# Patient Record
Sex: Male | Born: 1950
Health system: Southern US, Community
[De-identification: ages and names within clinical notes are randomized; demographics above are authoritative.]

## PROBLEM LIST (undated history)

## (undated) DIAGNOSIS — N189 Chronic kidney disease, unspecified: Secondary | ICD-10-CM

## (undated) DIAGNOSIS — M1990 Unspecified osteoarthritis, unspecified site: Secondary | ICD-10-CM

## (undated) DIAGNOSIS — N400 Enlarged prostate without lower urinary tract symptoms: Secondary | ICD-10-CM

## (undated) DIAGNOSIS — E785 Hyperlipidemia, unspecified: Secondary | ICD-10-CM

## (undated) DIAGNOSIS — N529 Male erectile dysfunction, unspecified: Secondary | ICD-10-CM

## (undated) HISTORY — DX: Unspecified osteoarthritis, unspecified site: M19.90

## (undated) HISTORY — DX: Benign prostatic hyperplasia without lower urinary tract symptoms: N40.0

## (undated) HISTORY — DX: Chronic kidney disease, unspecified: N18.9

## (undated) HISTORY — DX: Hyperlipidemia, unspecified: E78.5

## (undated) HISTORY — DX: Male erectile dysfunction, unspecified: N52.9

## (undated) HISTORY — PX: OTHER SURGICAL HISTORY: SHX169

## (undated) HISTORY — PX: COLONOSCOPY W/ POLYPECTOMY: SHX1380

---

## 1996-04-21 HISTORY — PX: ANKLE FRACTURE SURGERY: SHX122

## 2009-09-25 ENCOUNTER — Encounter (INDEPENDENT_AMBULATORY_CARE_PROVIDER_SITE_OTHER): Payer: Self-pay | Admitting: *Deleted

## 2009-09-25 ENCOUNTER — Ambulatory Visit: Payer: Self-pay | Admitting: Gastroenterology

## 2009-10-09 ENCOUNTER — Ambulatory Visit: Payer: Self-pay | Admitting: Gastroenterology

## 2009-10-12 ENCOUNTER — Encounter: Payer: Self-pay | Admitting: Gastroenterology

## 2010-05-21 NOTE — Miscellaneous (Signed)
Summary: [facility]  Clinical Lists Changes  Medications: Added new medication of MOVIPREP 100 GM  SOLR (PEG-KCL-NACL-NASULF-NA ASC-C) As per prep instructions. - Signed Rx of MOVIPREP 100 GM  SOLR (PEG-KCL-NACL-NASULF-NA ASC-C) As per prep instructions.;  #1 x 0;  Signed;  Entered by: Ezra Sites RN;  Authorized by: Mardella Layman MD [facility];  Method used: Electronically to [facility] [address], [place], [place], [place]  [address], Ph: [phone], Fax: [phone] Observations: Added new observation of NKA: T (09/25/2009 13:14)    Prescriptions: MOVIPREP 100 GM  SOLR (PEG-KCL-NACL-NASULF-NA ASC-C) As per prep instructions.  #1 x 0   Entered by:   Ezra Sites RN   Authorized by:   Mardella Layman MD [facility]   Signed by:   Ezra Sites RN on 09/25/2009   Method used:   Electronically to        [facility]  [address] (retail)       [address]       [place], [place]  [address]       Ph: [phone]       Fax: [phone]   RxID:   [id number]

## 2010-05-21 NOTE — Letter (Signed)
Summary: [facility] Instructions  [facility]  [address] [place], [place] [address]   Phone: [phone]  Fax: [phone]       Jimmy Bonilla    July 04, 1950    MRN: [id number]        Procedure Day /Date:  Tuesday 10/09/2009     Arrival Time: 8:00 am      Procedure Time: 9:00 am     Location of Procedure:                    _x _  [facility] (4th Floor)                        PREPARATION FOR COLONOSCOPY WITH MOVIPREP   Starting 5 days prior to your procedure Thursday 6/16 do not eat nuts, seeds, popcorn, corn, beans, peas,  salads, or any raw vegetables.  Do not take any fiber supplements (e.g. Metamucil, Citrucel, and Benefiber).  THE DAY BEFORE YOUR PROCEDURE         DATE: Monday 6/20  1.  Drink clear liquids the entire day-NO SOLID FOOD  2.  Do not drink anything colored red or purple.  Avoid juices with pulp.  No orange juice.  3.  Drink at least 64 oz. (8 glasses) of fluid/clear liquids during the day to prevent dehydration and help the prep work efficiently.  CLEAR LIQUIDS INCLUDE: Water Jello Ice Popsicles Tea (sugar ok, no milk/cream) Powdered fruit flavored drinks Coffee (sugar ok, no milk/cream) Gatorade Juice: apple, white grape, white cranberry  Lemonade Clear bullion, consomm, broth Carbonated beverages (any kind) Strained chicken noodle soup Hard Candy                             4.  In the morning, mix first dose of MoviPrep solution:    Empty 1 Pouch A and 1 Pouch B into the disposable container    Add lukewarm drinking water to the top line of the container. Mix to dissolve    Refrigerate (mixed solution should be used within 24 hrs)  5.  Begin drinking the prep at 5:00 p.m. The MoviPrep container is divided by 4 marks.   Every 15 minutes drink the solution down to the next mark (approximately 8 oz) until the full liter is complete.   6.  Follow completed prep with 16 oz of clear liquid of your choice (Nothing  red or purple).  Continue to drink clear liquids until bedtime.  7.  Before going to bed, mix second dose of MoviPrep solution:    Empty 1 Pouch A and 1 Pouch B into the disposable container    Add lukewarm drinking water to the top line of the container. Mix to dissolve    Refrigerate  THE DAY OF YOUR PROCEDURE      DATE: Tuesday 6/21  Beginning at 4:00 a.m. (5 hours before procedure):         1. Every 15 minutes, drink the solution down to the next mark (approx 8 oz) until the full liter is complete.  2. Follow completed prep with 16 oz. of clear liquid of your choice.    3. You may drink clear liquids until 7:00 am (2 HOURS BEFORE PROCEDURE).   MEDICATION INSTRUCTIONS  Unless otherwise instructed, you should take regular prescription medications with a small sip of water   as early as possible the morning of  your procedure.           OTHER INSTRUCTIONS  You will need a responsible adult at least 59 years of age to accompany you and drive you home.   This person must remain in the waiting room during your procedure.  Wear loose fitting clothing that is easily removed.  Leave jewelry and other valuables at home.  However, you may wish to bring a book to read or  an iPod/MP3 player to listen to music as you wait for your procedure to start.  Remove all body piercing jewelry and leave at home.  Total time from sign-in until discharge is approximately 2-3 hours.  You should go home directly after your procedure and rest.  You can resume normal activities the  day after your procedure.  The day of your procedure you should not:   Drive   Make legal decisions   Operate machinery   Drink alcohol   Return to work  You will receive specific instructions about eating, activities and medications before you leave.    The above instructions have been reviewed and explained to me by  Ezra Sites RN  September 25, 2009 1:46 PM      I fully understand and can  verbalize these instructions _____________________________ Date _________

## 2010-05-21 NOTE — Procedures (Signed)
Summary: Colonoscopy  Patient: Jimmy Bonilla Note: All result statuses are Final unless otherwise noted.  Tests: (1) Colonoscopy (COL)   COL Colonoscopy           DONE     [facility]     [address]     [place], [place]  [address]           COLONOSCOPY PROCEDURE REPORT           PATIENT:  [name], [name]  MR#:  [id number]     BIRTHDATE:  May 20, 1950, 58 yrs. old  GENDER:  male     ENDOSCOPIST:  Vania Rea. Jarold Motto, MD, [facility]     REF. BY:  Rudi Heap, M.D.     PROCEDURE DATE:  10/09/2009     PROCEDURE:  Average-risk screening colonoscopy     G0121     ASA CLASS:  Class I     INDICATIONS:  Routine Risk Screening     MEDICATIONS:   Fentanyl 50 mcg IV, Versed 6 mg IV           DESCRIPTION OF PROCEDURE:   After the risks benefits and     alternatives of the procedure were thoroughly explained, informed     consent was obtained.  Digital rectal exam was performed and     revealed no abnormalities.   The LB [id number] endoscope     was introduced through the anus and advanced to the cecum, which     was identified by both the appendix and ileocecal valve, without     limitations.  The quality of the prep was excellent, using     MoviPrep.  The instrument was then slowly withdrawn as the colon     was fully examined.     <<PROCEDUREIMAGES>>           FINDINGS:  A sessile polyp was found in the descending colon. 5mm     flat polyp hot snare excised.  This was otherwise a normal     examination of the colon.   Retroflexed views in the rectum     revealed hypertrophied anal papillae.    The scope was then     withdrawn from the patient and the procedure completed.           COMPLICATIONS:  None     ENDOSCOPIC IMPRESSION:     1) Sessile polyp in the descending colon     2) Otherwise normal examination     3) Hypertrophied anal papillae     R/O ADENOMA.     RECOMMENDATIONS:     1) If the polyp(s) removed today are proven to be adenomatous     (pre-cancerous)  polyps, you will need a repeat colonoscopy in 5     years. Otherwise you should continue to follow colorectal cancer     screening guidelines for "routine risk" patients with colonoscopy     in 10 years.     REPEAT EXAM:  No           ______________________________     Vania Rea. Jarold Motto, MD, [facility]           CC:           n.     eSIGNED:   Vania Rea. Patterson at 10/09/2009 09:38 AM           [name], [id number]  Note: An exclamation mark (!) indicates a result that was not dispersed into the flowsheet.  Document Creation Date: 10/09/2009 9:39 AM _______________________________________________________________________  (1) Order result status: Final Collection or observation date-time: 10/09/2009 09:31 Requested date-time:  Receipt date-time:  Reported date-time:  Referring Physician:   Ordering Physician: Sheryn Bison [phone]) Specimen Source:  Source: Launa Grill Order Number: [id number] Lab site:   Appended Document: Colonoscopy     Procedures Next Due Date:    Colonoscopy: 09/2012

## 2010-05-21 NOTE — Letter (Signed)
Summary: Patient Notice- Polyp Results  [facility]  [address] [place], [place] [address]   Phone: [phone]  Fax: [phone]        October 12, 2009 MRN: [id number]    Jimmy Bonilla [address] [place], [place]  [address]    Dear Mr. MENG,  I am pleased to inform you that the colon polyp(s) removed during your recent colonoscopy was (were) found to be benign (no cancer detected) upon pathologic examination.  I recommend you have a repeat colonoscopy examination in 3_ years to look for recurrent polyps, as having colon polyps increases your risk for having recurrent polyps or even colon cancer in the future.POLYP IS A SERRATED ADENOMA....RECHECK EXAM 3 YEARS RECOMMENDED.  Should you develop new or worsening symptoms of abdominal pain, bowel habit changes or bleeding from the rectum or bowels, please schedule an evaluation with either your primary care physician or with me.  Additional information/recommendations:  X__ No further action with gastroenterology is needed at this time. Please      follow-up with your primary care physician for your other healthcare      needs.  __ Please call [phone] to schedule a return visit to review your      situation.  __ Please keep your follow-up visit as already scheduled.  __ Continue treatment plan as outlined the day of your exam.  Please call us if you are having persistent problems or have questions about your condition that have not been fully answered at this time.  Sincerely,  Mardella Layman MD [facility]  This letter has been electronically signed by your physician.  Appended Document: Patient Notice- Polyp Results letter mailed.

## 2011-03-27 DIAGNOSIS — N529 Male erectile dysfunction, unspecified: Secondary | ICD-10-CM | POA: Insufficient documentation

## 2011-03-27 DIAGNOSIS — R972 Elevated prostate specific antigen [PSA]: Secondary | ICD-10-CM | POA: Insufficient documentation

## 2011-11-21 LAB — LIPID PANEL: LDL Cholesterol: 108 mg/dL

## 2011-11-21 LAB — BASIC METABOLIC PANEL
BUN: 15 mg/dL (ref 4–21)
Creatinine: 0.9 mg/dL (ref 0.6–1.3)
Glucose: 86 mg/dL

## 2011-11-21 LAB — HEPATIC FUNCTION PANEL
AST: 18 U/L (ref 14–40)
Alkaline Phosphatase: 69 U/L (ref 25–125)
Bilirubin, Direct: 0.2 mg/dL (ref 0.01–0.4)
Bilirubin, Total: 0.7 mg/dL

## 2012-03-31 DIAGNOSIS — K4090 Unilateral inguinal hernia, without obstruction or gangrene, not specified as recurrent: Secondary | ICD-10-CM | POA: Insufficient documentation

## 2012-07-01 ENCOUNTER — Encounter: Payer: Self-pay | Admitting: *Deleted

## 2012-07-06 ENCOUNTER — Encounter: Payer: Self-pay | Admitting: Physician Assistant

## 2012-07-06 ENCOUNTER — Ambulatory Visit (INDEPENDENT_AMBULATORY_CARE_PROVIDER_SITE_OTHER): Payer: BC Managed Care – PPO | Admitting: Physician Assistant

## 2012-07-06 VITALS — BP 139/82 | HR 49 | Temp 97.0°F | Ht 70.0 in | Wt 179.2 lb

## 2012-07-06 DIAGNOSIS — I10 Essential (primary) hypertension: Secondary | ICD-10-CM | POA: Insufficient documentation

## 2012-07-06 DIAGNOSIS — Z0000 Encounter for general adult medical examination without abnormal findings: Secondary | ICD-10-CM

## 2012-07-06 DIAGNOSIS — N400 Enlarged prostate without lower urinary tract symptoms: Secondary | ICD-10-CM

## 2012-07-06 DIAGNOSIS — E785 Hyperlipidemia, unspecified: Secondary | ICD-10-CM | POA: Insufficient documentation

## 2012-07-06 LAB — ALT: ALT: 18 U/L (ref 0–53)

## 2012-07-06 LAB — POCT CBC
Lymph, poc: 1.4 (ref 0.6–3.4)
MCH, POC: 31.2 pg (ref 27–31.2)
MCHC: 33.5 g/dL (ref 31.8–35.4)
MPV: 7.8 fL (ref 0–99.8)
POC LYMPH PERCENT: 24.6 %L (ref 10–50)
Platelet Count, POC: 238 10*3/uL (ref 142–424)
RBC: 4.7 M/uL (ref 4.69–6.13)
RDW, POC: 13.8 %
WBC: 5.8 10*3/uL (ref 4.6–10.2)

## 2012-07-06 LAB — POCT URINALYSIS DIPSTICK
Bilirubin, UA: NEGATIVE
Glucose, UA: NEGATIVE
Leukocytes, UA: NEGATIVE
Nitrite, UA: NEGATIVE
pH, UA: 8

## 2012-07-06 LAB — AST: AST: 22 U/L (ref 0–37)

## 2012-07-06 LAB — BASIC METABOLIC PANEL
Chloride: 101 mEq/L (ref 96–112)
Creat: 0.85 mg/dL (ref 0.50–1.35)
Potassium: 5.1 mEq/L (ref 3.5–5.3)

## 2012-07-06 LAB — LIPID PANEL
Cholesterol: 153 mg/dL (ref 0–200)
Total CHOL/HDL Ratio: 3.7 Ratio

## 2012-07-06 NOTE — Progress Notes (Signed)
  Subjective:    Patient ID: Jimmy Bonilla, male    DOB: 1950-06-26, 62 y.o.   MRN: [id number]  HPI    Review of Systems  All other systems reviewed and are negative.       Objective:   Physical Exam  Vitals reviewed. Constitutional: He is oriented to person, place, and time. He appears well-developed and well-nourished.  HENT:  Head: Normocephalic and atraumatic.  Right Ear: External ear normal.  Left Ear: External ear normal.  Nose: Nose normal.  Mouth/Throat: Oropharynx is clear and moist.  Eyes: Conjunctivae and EOM are normal. Pupils are equal, round, and reactive to light.  Neck: Normal range of motion. Neck supple.  Cardiovascular: Normal rate, regular rhythm, normal heart sounds and intact distal pulses.   Pulmonary/Chest: Effort normal and breath sounds normal.  Abdominal: Soft. Bowel sounds are normal.  Genitourinary: Rectum normal and prostate normal.  Musculoskeletal: Normal range of motion.  Neurological: He is alert and oriented to person, place, and time. He has normal reflexes.  Skin: Skin is warm and dry.  Psychiatric: He has a normal mood and affect. His behavior is normal. Judgment and thought content normal.          Assessment & Plan:  Complete Physical Exam Hyperlipidemia BPH  Orders Placed This Encounter  Procedures  . Basic Metabolic Panel  . AST  . ALT  . Lipid Panel  . PSA  . Urinalysis Dipstick  . POCT CBC

## 2012-07-12 ENCOUNTER — Other Ambulatory Visit: Payer: Self-pay | Admitting: Physician Assistant

## 2012-07-26 ENCOUNTER — Encounter: Payer: Self-pay | Admitting: General Practice

## 2012-07-26 ENCOUNTER — Ambulatory Visit (INDEPENDENT_AMBULATORY_CARE_PROVIDER_SITE_OTHER): Payer: BC Managed Care – PPO | Admitting: General Practice

## 2012-07-26 VITALS — BP 126/77 | HR 60 | Temp 97.0°F | Ht 70.0 in | Wt 180.0 lb

## 2012-07-26 DIAGNOSIS — J069 Acute upper respiratory infection, unspecified: Secondary | ICD-10-CM

## 2012-07-26 MED ORDER — AZITHROMYCIN 250 MG PO TABS: ORAL_TABLET | ORAL | Status: DC

## 2012-07-26 NOTE — Patient Instructions (Signed)

## 2012-07-26 NOTE — Progress Notes (Signed)
  Subjective:    Patient ID: Jimmy Bonilla, male    DOB: 06/25/1950, 62 y.o.   MRN: [id number]  Sinus Problem This is a new problem. The current episode started in the past 7 days. The problem has been gradually worsening since onset. There has been no fever. The pain is mild. Associated symptoms include congestion, coughing, sinus pressure and sneezing. Pertinent negatives include no chills or headaches. Past treatments include nothing.      Review of Systems  Constitutional: Negative for chills.  HENT: Positive for congestion, rhinorrhea, sneezing and sinus pressure.   Eyes: Positive for itching.  Respiratory: Positive for cough. Negative for chest tightness.   Cardiovascular: Negative for chest pain and palpitations.  Genitourinary: Negative for difficulty urinating.  Neurological: Negative for numbness and headaches.       Objective:   Physical Exam  Constitutional: He is oriented to person, place, and time. He appears well-developed and well-nourished.  HENT:  Head: Normocephalic and atraumatic.  Right Ear: External ear normal.  Left Ear: External ear normal.  Mouth/Throat: Posterior oropharyngeal erythema present.  Eyes: Conjunctivae and EOM are normal. Pupils are equal, round, and reactive to light.  Cardiovascular: Normal rate, regular rhythm and normal heart sounds.   No murmur heard. Pulmonary/Chest: No respiratory distress. He exhibits no tenderness.  Neurological: He is alert and oriented to person, place, and time.  Skin: Skin is warm and dry.  Psychiatric: He has a normal mood and affect.          Assessment & Plan:  Continue antibiotics even if feeling better Increase fluid intake Motrin or tylenol OTC OTC decongestant Proper handwashing Patient verbalized understanding  Raymon Mutton, FNP-C

## 2012-08-18 ENCOUNTER — Encounter: Payer: Self-pay | Admitting: Gastroenterology

## 2012-09-24 ENCOUNTER — Other Ambulatory Visit: Payer: Self-pay | Admitting: *Deleted

## 2012-09-24 MED ORDER — ATORVASTATIN CALCIUM 10 MG PO TABS: 10.0000 mg | ORAL_TABLET | Freq: Every day | ORAL | Status: DC

## 2012-11-16 ENCOUNTER — Encounter: Payer: Self-pay | Admitting: Gastroenterology

## 2012-12-06 ENCOUNTER — Ambulatory Visit (AMBULATORY_SURGERY_CENTER): Payer: BC Managed Care – PPO | Admitting: *Deleted

## 2012-12-06 ENCOUNTER — Encounter: Payer: Self-pay | Admitting: Gastroenterology

## 2012-12-06 VITALS — Ht 70.75 in | Wt 184.6 lb

## 2012-12-06 DIAGNOSIS — Z86010 Personal history of colonic polyps: Secondary | ICD-10-CM

## 2012-12-06 MED ORDER — MOVIPREP 100 G PO SOLR: 1.0000 | Freq: Once | ORAL | Status: DC

## 2012-12-06 NOTE — Progress Notes (Signed)
No egg or soy allergy. No anesthesia problems.  

## 2012-12-22 ENCOUNTER — Ambulatory Visit (AMBULATORY_SURGERY_CENTER): Payer: BC Managed Care – PPO | Admitting: Gastroenterology

## 2012-12-22 ENCOUNTER — Encounter: Payer: Self-pay | Admitting: Gastroenterology

## 2012-12-22 VITALS — BP 120/59 | HR 49 | Temp 97.4°F | Resp 14 | Ht 70.0 in | Wt 184.0 lb

## 2012-12-22 DIAGNOSIS — Z86010 Personal history of colonic polyps: Secondary | ICD-10-CM

## 2012-12-22 MED ORDER — SODIUM CHLORIDE 0.9 % IV SOLN: 500.0000 mL | INTRAVENOUS | Status: DC

## 2012-12-22 NOTE — Op Note (Signed)
[facility] [address] [place], [address]   COLONOSCOPY PROCEDURE REPORT  PATIENT: Jimmy, Bonilla  MR#: [id number] BIRTHDATE: 07/27/1950 , 61  yrs. old GENDER: Male ENDOSCOPIST: Mardella Layman, MD, [facility] REFERRED BY: PROCEDURE DATE:  12/22/2012 PROCEDURE:   Colonoscopy, surveillance First Screening Colonoscopy - Avg.  risk and is 50 yrs.  old or older - No.  Prior Negative Screening - Now for repeat screening. Less than 10 yrs  History of Adenoma - Now for follow-up colonoscopy & has been > or = to 3 yrs.  Yes hx of adenoma.  Has been 3 or more years since last colonoscopy. ASA CLASS:   Class II INDICATIONS:Patient's personal history of adenomatous colon polyps.  MEDICATIONS: propofol (Diprivan) 150mg  IV  DESCRIPTION OF PROCEDURE:   After the risks benefits and alternatives of the procedure were thoroughly explained, informed consent was obtained.  A digital rectal exam revealed no abnormalities of the rectum.   The LB [id number]  endoscope was introduced through the anus and advanced to the cecum, which was identified by both the appendix and ileocecal valve. No adverse events experienced.   The quality of the prep was good, using MoviPrep  The instrument was then slowly withdrawn as the colon was fully examined.      COLON FINDINGS: A normal appearing cecum, ileocecal valve, and appendiceal orifice were identified.  The ascending, hepatic flexure, transverse, splenic flexure, descending, sigmoid colon and rectum appeared unremarkable.  No polyps or cancers were seen. Retroflexed views revealed no abnormalities. The time to cecum=4 minutes 15 seconds.  Withdrawal time=6 minutes 00 seconds.  The scope was withdrawn and the procedure completed. COMPLICATIONS: There were no complications.  ENDOSCOPIC IMPRESSION: Normal colon...no recurrent serrated adenoma noted...  RECOMMENDATIONS: 1.  Repeat Colonoscopy in 5 years. 2.  Continue current  medications   eSigned:  Mardella Layman, MD, [facility] 12/22/2012 9:30 AM   cc: Rudi Heap, MD

## 2012-12-22 NOTE — Patient Instructions (Addendum)
YOU HAD AN ENDOSCOPIC PROCEDURE TODAY AT THE [facility]: Refer to the procedure report that was given to you for any specific questions about what was found during the examination.  If the procedure report does not answer your questions, please call your gastroenterologist to clarify.  If you requested that your care partner not be given the details of your procedure findings, then the procedure report has been included in a sealed envelope for you to review at your convenience later.  YOU SHOULD EXPECT: Some feelings of bloating in the abdomen. Passage of more gas than usual.  Walking can help get rid of the air that was put into your GI tract during the procedure and reduce the bloating. If you had a lower endoscopy (such as a colonoscopy or flexible sigmoidoscopy) you may notice spotting of blood in your stool or on the toilet paper. If you underwent a bowel prep for your procedure, then you may not have a normal bowel movement for a few days.  DIET: Your first meal following the procedure should be a light meal and then it is ok to progress to your normal diet.  A half-sandwich or bowl of soup is an example of a good first meal.  Heavy or fried foods are harder to digest and may make you feel nauseous or bloated.  Likewise meals heavy in dairy and vegetables can cause extra gas to form and this can also increase the bloating.  Drink plenty of fluids but you should avoid alcoholic beverages for 24 hours.  ACTIVITY: Your care partner should take you home directly after the procedure.  You should plan to take it easy, moving slowly for the rest of the day.  You can resume normal activity the day after the procedure however you should NOT DRIVE or use heavy machinery for 24 hours (because of the sedation medicines used during the test).    SYMPTOMS TO REPORT IMMEDIATELY: A gastroenterologist can be reached at any hour.  During normal business hours, 8:30 AM to 5:00 PM Monday through Friday,  call [phone].  After hours and on weekends, please call the GI answering service at [phone] who will take a message and have the physician on call contact you.   Following lower endoscopy (colonoscopy or flexible sigmoidoscopy):  Excessive amounts of blood in the stool  Significant tenderness or worsening of abdominal pains  Swelling of the abdomen that is new, acute  Fever of 100F or higher  FOLLOW UP: If any biopsies were taken you will be contacted by phone or by letter within the next 1-3 weeks.  Call your gastroenterologist if you have not heard about the biopsies in 3 weeks.  Our staff will call the home number listed on your records the next business day following your procedure to check on you and address any questions or concerns that you may have at that time regarding the information given to you following your procedure. This is a courtesy call and so if there is no answer at the home number and we have not heard from you through the emergency physician on call, we will assume that you have returned to your regular daily activities without incident.  SIGNATURES/CONFIDENTIALITY: You and/or your care partner have signed paperwork which will be entered into your electronic medical record.  These signatures attest to the fact that that the information above on your After Visit Summary has been reviewed and is understood.  Full responsibility of the confidentiality of this   discharge information lies with you and/or your care-partner.  Repeat colonoscopy in 5 years 

## 2012-12-22 NOTE — Progress Notes (Signed)
Patient did not experience any of the following events: a burn prior to discharge; a fall within the facility; wrong site/side/patient/procedure/implant event; or a hospital transfer or hospital admission upon discharge from the facility. (G8907) Patient did not have preoperative order for IV antibiotic SSI prophylaxis. (G8918)  

## 2012-12-22 NOTE — Progress Notes (Signed)
Lidocaine-40mg IV prior to Propofol InductionPropofol given over incremental dosages 

## 2012-12-23 ENCOUNTER — Telehealth: Payer: Self-pay | Admitting: *Deleted

## 2012-12-23 NOTE — Telephone Encounter (Signed)
Left message that we called for f/u 

## 2013-04-18 ENCOUNTER — Other Ambulatory Visit: Payer: Self-pay

## 2013-04-18 MED ORDER — ATORVASTATIN CALCIUM 10 MG PO TABS: 10.0000 mg | ORAL_TABLET | Freq: Every day | ORAL | Status: DC

## 2013-04-18 NOTE — Telephone Encounter (Signed)
Last seen 07/26/12  Jimmy Bonilla  Last lipid 07/06/12

## 2013-09-19 ENCOUNTER — Ambulatory Visit (INDEPENDENT_AMBULATORY_CARE_PROVIDER_SITE_OTHER): Payer: BC Managed Care – PPO | Admitting: Family Medicine

## 2013-09-19 ENCOUNTER — Encounter: Payer: Self-pay | Admitting: Family Medicine

## 2013-09-19 VITALS — BP 120/71 | HR 56 | Temp 97.0°F | Ht 69.5 in | Wt 182.0 lb

## 2013-09-19 DIAGNOSIS — E785 Hyperlipidemia, unspecified: Secondary | ICD-10-CM

## 2013-09-19 DIAGNOSIS — R5383 Other fatigue: Secondary | ICD-10-CM

## 2013-09-19 DIAGNOSIS — R5381 Other malaise: Secondary | ICD-10-CM

## 2013-09-19 DIAGNOSIS — Z09 Encounter for follow-up examination after completed treatment for conditions other than malignant neoplasm: Secondary | ICD-10-CM

## 2013-09-19 DIAGNOSIS — Z125 Encounter for screening for malignant neoplasm of prostate: Secondary | ICD-10-CM

## 2013-09-19 DIAGNOSIS — Z0000 Encounter for general adult medical examination without abnormal findings: Secondary | ICD-10-CM

## 2013-09-19 LAB — POCT CBC
Granulocyte percent: 69.8 %G (ref 37–80)
HCT, POC: 45 % (ref 43.5–53.7)
Hemoglobin: 14.5 g/dL (ref 14.1–18.1)
Lymph, poc: 1.2 (ref 0.6–3.4)
MCH, POC: 31 pg (ref 27–31.2)
MCHC: 32.3 g/dL (ref 31.8–35.4)
MCV: 95.8 fL (ref 80–97)
MPV: 7.8 fL (ref 0–99.8)
POC Granulocyte: 4.1 (ref 2–6.9)
POC LYMPH PERCENT: 21.1 %L (ref 10–50)
Platelet Count, POC: 236 10*3/uL (ref 142–424)
RBC: 4.7 M/uL (ref 4.69–6.13)
RDW, POC: 13.1 %
WBC: 5.9 10*3/uL (ref 4.6–10.2)

## 2013-09-19 NOTE — Progress Notes (Signed)
Subjective:    Patient ID: Jimmy Bonilla, male    DOB: 02/01/1951, 62 y.o.   MRN: [id number]  HPI This 62 y.o. 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  He is very active and walks and swims daily.  He has hx of hyperlipidemia and ED.  He has no acute complaints.   Review of Systems    No chest pain, SOB, HA, dizziness, vision change, N/V, diarrhea, constipation, dysuria, urinary urgency or frequency, myalgias, arthralgias or rash.  Objective:   Physical Exam  Vital signs noted  Well developed well nourished male.  HEENT - Head atraumatic Normocephalic                Eyes - PERRLA, Conjuctiva - clear Sclera- Clear EOMI                Ears - EAC's Wnl TM's Wnl Gross Hearing WNL                Nose - Nares patent                 Throat - oropharanx wnl Respiratory - Lungs CTA bilateral Cardiac - RRR S1 and S2 without murmur GI - Abdomen soft Nontender and bowel sounds active x 4 Extremities - No edema. Neuro - Grossly intact.      Assessment & Plan:  Hyperlipemia - Plan: CMP14+EGFR, Lipid panel  Fatigue - Plan: POCT CBC, CMP14+EGFR, Vit D  25 hydroxy (rtn osteoporosis monitoring), Thyroid Panel With TSH  Screening for prostate cancer - Plan: PSA, total and free  Follow-up examination, following unspecified surgery - Plan: POCT CBC, CMP14+EGFR, Lipid panel, Vit D  25 hydroxy (rtn osteoporosis monitoring), PSA, total and free  William J Oxford FNP 

## 2013-09-20 LAB — LIPID PANEL
Chol/HDL Ratio: 3.7 ratio units (ref 0.0–5.0)
Cholesterol, Total: 155 mg/dL (ref 100–199)
HDL: 42 mg/dL (ref >39)
LDL Calculated: 89 mg/dL (ref 0–99)
Triglycerides: 121 mg/dL (ref 0–149)
VLDL Cholesterol Cal: 24 mg/dL (ref 5–40)

## 2013-09-20 LAB — CMP14+EGFR
ALT: 12 IU/L (ref 0–44)
AST: 17 IU/L (ref 0–40)
Albumin/Globulin Ratio: 1.9 (ref 1.1–2.5)
Albumin: 4.4 g/dL (ref 3.6–4.8)
Alkaline Phosphatase: 74 IU/L (ref 39–117)
BUN/Creatinine Ratio: 14 (ref 10–22)
BUN: 11 mg/dL (ref 8–27)
CO2: 25 mmol/L (ref 18–29)
Calcium: 9.6 mg/dL (ref 8.6–10.2)
Chloride: 103 mmol/L (ref 97–108)
Creatinine, Ser: 0.78 mg/dL (ref 0.76–1.27)
GFR calc Af Amer: 113 mL/min/{1.73_m2} (ref >59)
GFR calc non Af Amer: 97 mL/min/{1.73_m2} (ref >59)
Globulin, Total: 2.3 g/dL (ref 1.5–4.5)
Glucose: 81 mg/dL (ref 65–99)
Potassium: 4.9 mmol/L (ref 3.5–5.2)
Sodium: 146 mmol/L — ABNORMAL HIGH (ref 134–144)
Total Bilirubin: 0.5 mg/dL (ref 0.0–1.2)
Total Protein: 6.7 g/dL (ref 6.0–8.5)

## 2013-09-20 LAB — THYROID PANEL WITH TSH
Free Thyroxine Index: 1.9 (ref 1.2–4.9)
T3 Uptake Ratio: 26 % (ref 24–39)
T4, Total: 7.4 ug/dL (ref 4.5–12.0)
TSH: 1.41 u[IU]/mL (ref 0.450–4.500)

## 2013-09-20 LAB — PSA, TOTAL AND FREE
PSA, Free Pct: 21.1 %
PSA, Free: 1.33 ng/mL
PSA: 6.3 ng/mL — ABNORMAL HIGH (ref 0.0–4.0)

## 2013-09-20 LAB — VITAMIN D 25 HYDROXY (VIT D DEFICIENCY, FRACTURES): Vit D, 25-Hydroxy: 30.4 ng/mL (ref 30.0–100.0)

## 2013-09-23 ENCOUNTER — Telehealth: Payer: Self-pay | Admitting: Family Medicine

## 2013-09-23 NOTE — Telephone Encounter (Signed)
Mr. Lofgreen aware of PSA results. He wants to see Dr. Earlene Plater Urologist who is affilated with [facility].  He has seen him before.  Call Neziah at [phone] to let him know appt date/time or if you have questions.

## 2013-09-23 NOTE — Telephone Encounter (Signed)
Message copied by Azalee Course on Fri Sep 23, 2013 11:57 AM ------      Message from: Deatra Canter      Created: Tue Sep 20, 2013 10:21 AM       PSA is elevated and about the same as it was last year, Did he see a urologist, if not then would recommend him seeing urology to make sure he doesn't have prostate CA, will put in consult if he has not seen urology. ------

## 2013-09-26 NOTE — Telephone Encounter (Signed)
Please have patient see this urologist

## 2013-09-28 ENCOUNTER — Other Ambulatory Visit: Payer: Self-pay

## 2013-09-28 DIAGNOSIS — R972 Elevated prostate specific antigen [PSA]: Secondary | ICD-10-CM

## 2013-11-01 ENCOUNTER — Ambulatory Visit (INDEPENDENT_AMBULATORY_CARE_PROVIDER_SITE_OTHER): Payer: BC Managed Care – PPO | Admitting: Family Medicine

## 2013-11-01 VITALS — BP 123/66 | HR 54 | Temp 96.7°F | Ht 69.5 in | Wt 182.0 lb

## 2013-11-01 DIAGNOSIS — T6391XA Toxic effect of contact with unspecified venomous animal, accidental (unintentional), initial encounter: Secondary | ICD-10-CM

## 2013-11-01 DIAGNOSIS — T65891A Toxic effect of other specified substances, accidental (unintentional), initial encounter: Secondary | ICD-10-CM

## 2013-11-01 DIAGNOSIS — T63444A Toxic effect of venom of bees, undetermined, initial encounter: Secondary | ICD-10-CM

## 2013-11-01 MED ORDER — DOXYCYCLINE HYCLATE 100 MG PO TABS
100.0000 mg | ORAL_TABLET | Freq: Two times a day (BID) | ORAL | Status: DC
Start: 2013-11-01 — End: 2014-09-21

## 2013-11-01 MED ORDER — METHYLPREDNISOLONE ACETATE 80 MG/ML IJ SUSP
80.0000 mg | Freq: Once | INTRAMUSCULAR | Status: AC
  Administered 2013-11-01: 80 mg via INTRAMUSCULAR

## 2013-11-01 NOTE — Progress Notes (Signed)
Subjective:    Patient ID: Jimmy Bonilla, male    DOB: 1950-08-25, 63 y.o.   MRN: [id number]  HPI This 63 y.o. male presents for evaluation of bee sting to left hand and now is swollen and red. He was stung yesterday and he took benadryl last night.   Review of Systems No chest pain, SOB, HA, dizziness, vision change, N/V, diarrhea, constipation, dysuria, urinary urgency or frequency, myalgias, arthralgias or rash.     Objective:   Physical Exam  Vital signs noted  Well developed well nourished male.  HEENT - Head atraumatic Normocephalic Respiratory - Lungs CTA bilateral Cardiac - RRR S1 and S2 without murmur Skin - swelling and erythema left hand     Assessment & Plan:  Bee sting reaction, undetermined intent, initial encounter - Plan: doxycycline (VIBRA-TABS) 100 MG tablet, methylPREDNISolone acetate (DEPO-MEDROL) injection 80 mg  Deatra Canter FNP

## 2013-12-16 ENCOUNTER — Other Ambulatory Visit: Payer: Self-pay | Admitting: General Practice

## 2014-02-01 DIAGNOSIS — N200 Calculus of kidney: Secondary | ICD-10-CM | POA: Insufficient documentation

## 2014-09-13 ENCOUNTER — Other Ambulatory Visit: Payer: Self-pay | Admitting: Family Medicine

## 2014-09-14 NOTE — Telephone Encounter (Signed)
Patient will need to be seen he has not been seen in almost a year

## 2014-09-14 NOTE — Telephone Encounter (Signed)
Appointment given for 6/2 with Zenda AlpersWebster.

## 2014-09-21 ENCOUNTER — Encounter: Payer: Self-pay | Admitting: Physician Assistant

## 2014-09-21 ENCOUNTER — Encounter (INDEPENDENT_AMBULATORY_CARE_PROVIDER_SITE_OTHER): Payer: Self-pay

## 2014-09-21 ENCOUNTER — Ambulatory Visit (INDEPENDENT_AMBULATORY_CARE_PROVIDER_SITE_OTHER): Payer: BLUE CROSS/BLUE SHIELD | Admitting: Physician Assistant

## 2014-09-21 VITALS — BP 125/75 | HR 65 | Temp 97.3°F | Ht 70.0 in | Wt 180.2 lb

## 2014-09-21 DIAGNOSIS — Z0000 Encounter for general adult medical examination without abnormal findings: Secondary | ICD-10-CM | POA: Diagnosis not present

## 2014-09-21 MED ORDER — TAMSULOSIN HCL 0.4 MG PO CAPS: 0.4000 mg | ORAL_CAPSULE | Freq: Every day | ORAL | Status: DC

## 2014-09-21 MED ORDER — SILDENAFIL CITRATE 20 MG PO TABS: 20.0000 mg | ORAL_TABLET | Freq: Three times a day (TID) | ORAL | Status: DC

## 2014-09-21 MED ORDER — ATORVASTATIN CALCIUM 10 MG PO TABS: 10.0000 mg | ORAL_TABLET | Freq: Every day | ORAL | Status: DC

## 2014-09-21 NOTE — Patient Instructions (Signed)

## 2014-09-21 NOTE — Progress Notes (Signed)
Subjective:     Patient ID: Jimmy Bonilla, male   DOB: 08-Dec-1950, 64 y.o.   MRN: [id number]  HPI Pt here for PE Has sig hx of elevated PSA and has regular f/u with his Urol regarding this Also with hx of L ing hernia Pt has deferred any tx for hernia  Review of Systems  Constitutional: Negative.   HENT: Negative.   Eyes: Negative.   Respiratory: Negative.   Cardiovascular: Negative.   Gastrointestinal: Negative.   Endocrine: Negative.   Genitourinary: Negative.   Allergic/Immunologic: Negative.   Neurological: Negative.   Hematological: Negative.   Psychiatric/Behavioral: Negative.        Objective:   Physical Exam  Constitutional: He is oriented to person, place, and time. He appears well-developed and well-nourished.  HENT:  Head: Normocephalic and atraumatic.  Right Ear: External ear normal.  Left Ear: External ear normal.  Nose: Nose normal.  Mouth/Throat: Oropharynx is clear and moist.  Eyes: Pupils are equal, round, and reactive to light.  Neck: Normal range of motion. Neck supple. No JVD present. No thyromegaly present.  Cardiovascular: Normal rate, regular rhythm, normal heart sounds and intact distal pulses.   Pulmonary/Chest: Effort normal and breath sounds normal.  Abdominal: Soft. Bowel sounds are normal. He exhibits no distension and no mass. There is no tenderness. There is no rebound and no guarding.  Musculoskeletal: Normal range of motion.  Lymphadenopathy:    He has no cervical adenopathy.  Neurological: He is alert and oriented to person, place, and time. He has normal reflexes. No cranial nerve deficit.  Skin: Skin is warm.  Scaly lesion to the L cheek  Psychiatric: He has a normal mood and affect. His behavior is normal. Judgment and thought content normal.  Nursing note and vitals reviewed.      Assessment:     Physical exam Hyperlipid ED Elevated PSA Dermatitis    Plan:     Given chronic nature of lesion to the L cheek recommended  f/u with Derm for excision Pt would like to continue to wait on hernia repair at this time Labs done today- will inform of results Continue regular F/U with Urol Due to cost pt would like to try generic Viagra Weight is approp and pt had regular exercise regimen He is up to date on dental and eye exams

## 2014-09-22 LAB — LIPID PANEL
CHOLESTEROL TOTAL: 180 mg/dL (ref 100–199)
Chol/HDL Ratio: 4.9 ratio units (ref 0.0–5.0)
HDL: 37 mg/dL — ABNORMAL LOW (ref >39)
LDL Calculated: 110 mg/dL — ABNORMAL HIGH (ref 0–99)
TRIGLYCERIDES: 167 mg/dL — AB (ref 0–149)
VLDL Cholesterol Cal: 33 mg/dL (ref 5–40)

## 2014-09-22 LAB — CMP14+EGFR
A/G RATIO: 1.7 (ref 1.1–2.5)
ALT: 12 IU/L (ref 0–44)
AST: 19 IU/L (ref 0–40)
Albumin: 4.4 g/dL (ref 3.6–4.8)
Alkaline Phosphatase: 88 IU/L (ref 39–117)
BUN/Creatinine Ratio: 16 (ref 10–22)
BUN: 14 mg/dL (ref 8–27)
Bilirubin Total: 0.5 mg/dL (ref 0.0–1.2)
CALCIUM: 9.4 mg/dL (ref 8.6–10.2)
CO2: 25 mmol/L (ref 18–29)
Chloride: 98 mmol/L (ref 97–108)
Creatinine, Ser: 0.87 mg/dL (ref 0.76–1.27)
GFR calc non Af Amer: 92 mL/min/{1.73_m2} (ref >59)
GFR, EST AFRICAN AMERICAN: 107 mL/min/{1.73_m2} (ref >59)
GLUCOSE: 90 mg/dL (ref 65–99)
Globulin, Total: 2.6 g/dL (ref 1.5–4.5)
Potassium: 4.4 mmol/L (ref 3.5–5.2)
Sodium: 139 mmol/L (ref 134–144)
Total Protein: 7 g/dL (ref 6.0–8.5)

## 2014-09-26 ENCOUNTER — Telehealth: Payer: Self-pay | Admitting: Physician Assistant

## 2014-09-26 NOTE — Telephone Encounter (Signed)
He was going to get some names from his Urol as well Dr Ezzard StandingNewman or his practice is one I use

## 2014-09-26 NOTE — Telephone Encounter (Signed)
Please review

## 2014-09-27 NOTE — Telephone Encounter (Signed)
Notified patient. He will contact their office and get some info and let us know.

## 2014-10-06 ENCOUNTER — Other Ambulatory Visit: Payer: Self-pay | Admitting: Physician Assistant

## 2014-10-06 DIAGNOSIS — K4090 Unilateral inguinal hernia, without obstruction or gangrene, not specified as recurrent: Secondary | ICD-10-CM

## 2014-11-03 ENCOUNTER — Encounter: Payer: Self-pay | Admitting: Nurse Practitioner

## 2014-11-03 ENCOUNTER — Ambulatory Visit (INDEPENDENT_AMBULATORY_CARE_PROVIDER_SITE_OTHER): Payer: BLUE CROSS/BLUE SHIELD | Admitting: Nurse Practitioner

## 2014-11-03 VITALS — BP 148/87 | HR 67 | Temp 97.3°F | Ht 70.0 in | Wt 182.0 lb

## 2014-11-03 DIAGNOSIS — K088 Other specified disorders of teeth and supporting structures: Secondary | ICD-10-CM | POA: Diagnosis not present

## 2014-11-03 DIAGNOSIS — K0889 Other specified disorders of teeth and supporting structures: Secondary | ICD-10-CM

## 2014-11-03 MED ORDER — CLINDAMYCIN HCL 300 MG PO CAPS: 300.0000 mg | ORAL_CAPSULE | Freq: Three times a day (TID) | ORAL | Status: DC

## 2014-11-03 NOTE — Progress Notes (Signed)
   Subjective:    Patient ID: Jimmy Bonilla, male    DOB: December 10, 1950, 64 y.o.   MRN: [id number]  HPI Patient in today c/o of toothache that started Wednesday- rates pain 7-8/10- worsening when biting down- brushing teeth helps some.    Review of Systems  Constitutional: Negative.   Respiratory: Negative.   Cardiovascular: Negative.   Genitourinary: Negative.   Neurological: Negative.   Psychiatric/Behavioral: Negative.   All other systems reviewed and are negative.      Objective:   Physical Exam  Constitutional: He is oriented to person, place, and time. He appears well-developed and well-nourished. No distress.  HENT:  Erythematous gum along left post lower molar  Cardiovascular: Normal rate, regular rhythm and normal heart sounds.   Pulmonary/Chest: Effort normal and breath sounds normal.  Neurological: He is alert and oriented to person, place, and time.  Skin: Skin is warm.  Psychiatric: He has a normal mood and affect. His behavior is normal. Judgment and thought content normal.   BP 148/87 mmHg  Pulse 67  Temp(Src) 97.3 F (36.3 C) (Oral)  Ht 5\' 10"  (1.778 m)  Wt 182 lb (82.555 kg)  BMI 26.11 kg/m2        Assessment & Plan:   1. Toothache    sensodyne toothpaste Warm salt water gargle Motrin or tylenol OTC if needed Meds ordered this encounter  Medications  . clindamycin (CLEOCIN) 300 MG capsule    Sig: Take 1 capsule (300 mg total) by mouth 3 (three) times daily.    Dispense:  30 capsule    Refill:  0    Order Specific Question:  Supervising Provider    Answer:  Ernestina PennaMOORE, DONALD W [[id number]]   Mary-Margaret Daphine DeutscherMartin, FNP

## 2014-11-03 NOTE — Patient Instructions (Signed)

## 2014-11-07 ENCOUNTER — Encounter: Payer: Self-pay | Admitting: Physician Assistant

## 2014-11-07 ENCOUNTER — Ambulatory Visit (INDEPENDENT_AMBULATORY_CARE_PROVIDER_SITE_OTHER): Payer: BLUE CROSS/BLUE SHIELD | Admitting: Physician Assistant

## 2014-11-07 VITALS — BP 132/75 | HR 52 | Temp 97.0°F | Ht 70.0 in | Wt 184.2 lb

## 2014-11-07 DIAGNOSIS — L259 Unspecified contact dermatitis, unspecified cause: Secondary | ICD-10-CM

## 2014-11-07 MED ORDER — METHYLPREDNISOLONE ACETATE 80 MG/ML IJ SUSP
80.0000 mg | Freq: Once | INTRAMUSCULAR | Status: AC
  Administered 2014-11-07: 80 mg via INTRAMUSCULAR

## 2014-11-07 NOTE — Addendum Note (Signed)
Addended by: Fawn KirkHOLT, CATHY on: 11/07/2014 11:44 AM   Modules accepted: Orders

## 2014-11-07 NOTE — Patient Instructions (Signed)

## 2014-11-07 NOTE — Progress Notes (Signed)
Subjective:     Patient ID: Jimmy Bonilla, male   DOB: 28-May-1950, [age] y.o.   MRN: [id number]  HPI Pt here with poison oak to the lower ext Sx for 5 days and states getting worse He would like an injection  Review of Systems  Skin: Positive for rash.       Objective:   Physical Exam  Constitutional: He appears well-developed and well-nourished.  Nursing note and vitals reviewed. Diffuse erythem rash to the lower ext bilat form the knees down No secondary signs of infection seen     Assessment:     Contact Derm    Plan:     Nl course reviewed Depo Medrol 80 mg IM today OTC antihistamines F/U prn

## 2014-11-21 ENCOUNTER — Other Ambulatory Visit: Payer: Self-pay | Admitting: Physician Assistant

## 2014-12-04 ENCOUNTER — Encounter: Payer: Self-pay | Admitting: Gastroenterology

## 2015-04-22 HISTORY — PX: HERNIA REPAIR: SHX51

## 2015-09-24 ENCOUNTER — Encounter: Payer: Self-pay | Admitting: Family Medicine

## 2015-09-24 ENCOUNTER — Ambulatory Visit (INDEPENDENT_AMBULATORY_CARE_PROVIDER_SITE_OTHER): Payer: BLUE CROSS/BLUE SHIELD | Admitting: Family Medicine

## 2015-09-24 VITALS — BP 134/82 | HR 49 | Temp 96.8°F | Ht 70.0 in | Wt 184.2 lb

## 2015-09-24 DIAGNOSIS — E785 Hyperlipidemia, unspecified: Secondary | ICD-10-CM | POA: Insufficient documentation

## 2015-09-24 DIAGNOSIS — Z0000 Encounter for general adult medical examination without abnormal findings: Secondary | ICD-10-CM

## 2015-09-24 DIAGNOSIS — I10 Essential (primary) hypertension: Secondary | ICD-10-CM

## 2015-09-24 DIAGNOSIS — N400 Enlarged prostate without lower urinary tract symptoms: Secondary | ICD-10-CM

## 2015-09-24 MED ORDER — ATORVASTATIN CALCIUM 10 MG PO TABS: 10.0000 mg | ORAL_TABLET | Freq: Every day | ORAL | Status: DC

## 2015-09-24 MED ORDER — TAMSULOSIN HCL 0.4 MG PO CAPS: 0.4000 mg | ORAL_CAPSULE | Freq: Every day | ORAL | Status: DC

## 2015-09-24 NOTE — Progress Notes (Signed)
   HPI  Patient presents today for annual physical exam.  Patient is very active, he works out at least twice a week and walks every other day. He's also watching his diet very closely now for over 10 years, practically fat-free diet.  He has no complaints today. Using point with his urologist in 1 week to discuss BPH. He always checks his PSA.  Patient is fasting.  States he has bilateral inguinal hernias, he is Arty see general surgery for this, left is greater than right  Needs physical exam form filled out for [employer]    PMH: Smoking status noted ROS: Per HPI  Objective: BP 134/82 mmHg  Pulse 49  Temp(Src) 96.8 F (36 C) (Oral)  Ht '5\' 10"'$  (1.778 m)  Wt 184 lb 3.2 oz (83.553 kg)  BMI 26.43 kg/m2 Gen: NAD, alert, cooperative with exam HEENT: NCAT, EOMI, PERRL, TMs normal bilaterally, nares clear, oropharynx clear CV: RRR, good S1/S2, no murmur Resp: CTABL, no wheezes, non-labored Abd: SNTND, BS present, no guarding or organomegaly Ext: No edema, warm Neuro: Alert and oriented, No gross deficits GU Normal male genitalia, right-sided very small hernia palpated, left side not appreciated  Assessment and plan:  # Annual exam Normal exam, discussed supportive care and watchful waiting as it pertains to his inguinal hernias  # Hypertension Diet controlled No interventions at this time  # Hyperlipidemia Labs Continue statin  # Healthcare maintenance Hep C checked   BPH Managed by urology Continue Flomax, refilled PSA deferred to urology   Orders Placed This Encounter  Procedures  . CMP14+EGFR  . CBC with Differential  . Lipid Panel  . TSH  . VITAMIN D 25 Hydroxy (Vit-D Deficiency, Fractures)  . Hepatitis C antibody     Laroy Apple, MD [facility] 09/24/2015, 10:31 AM

## 2015-09-24 NOTE — Patient Instructions (Addendum)
Great to meet you!  Come see us next year unless you need us sooner  We will call with lab results within 1 week

## 2015-09-25 LAB — LIPID PANEL
CHOL/HDL RATIO: 3.5 ratio (ref 0.0–5.0)
CHOLESTEROL TOTAL: 144 mg/dL (ref 100–199)
HDL: 41 mg/dL (ref >39)
LDL Calculated: 78 mg/dL (ref 0–99)
TRIGLYCERIDES: 127 mg/dL (ref 0–149)
VLDL Cholesterol Cal: 25 mg/dL (ref 5–40)

## 2015-09-25 LAB — CBC WITH DIFFERENTIAL/PLATELET
BASOS ABS: 0 10*3/uL (ref 0.0–0.2)
Basos: 0 %
EOS (ABSOLUTE): 0.1 10*3/uL (ref 0.0–0.4)
Eos: 2 %
Hematocrit: 43.3 % (ref 37.5–51.0)
Hemoglobin: 14.6 g/dL (ref 12.6–17.7)
Immature Grans (Abs): 0 10*3/uL (ref 0.0–0.1)
Immature Granulocytes: 0 %
LYMPHS ABS: 1.2 10*3/uL (ref 0.7–3.1)
Lymphs: 21 %
MCH: 32.2 pg (ref 26.6–33.0)
MCHC: 33.7 g/dL (ref 31.5–35.7)
MCV: 95 fL (ref 79–97)
MONOS ABS: 0.6 10*3/uL (ref 0.1–0.9)
Monocytes: 11 %
Neutrophils Absolute: 3.6 10*3/uL (ref 1.4–7.0)
Neutrophils: 66 %
Platelets: 261 10*3/uL (ref 150–379)
RBC: 4.54 x10E6/uL (ref 4.14–5.80)
RDW: 13.2 % (ref 12.3–15.4)
WBC: 5.5 10*3/uL (ref 3.4–10.8)

## 2015-09-25 LAB — CMP14+EGFR
ALBUMIN: 4.4 g/dL (ref 3.6–4.8)
ALK PHOS: 84 IU/L (ref 39–117)
ALT: 11 IU/L (ref 0–44)
AST: 12 IU/L (ref 0–40)
Albumin/Globulin Ratio: 1.9 (ref 1.2–2.2)
BILIRUBIN TOTAL: 0.5 mg/dL (ref 0.0–1.2)
BUN / CREAT RATIO: 13 (ref 10–24)
BUN: 10 mg/dL (ref 8–27)
CHLORIDE: 102 mmol/L (ref 96–106)
CO2: 27 mmol/L (ref 18–29)
Calcium: 9.6 mg/dL (ref 8.6–10.2)
Creatinine, Ser: 0.77 mg/dL (ref 0.76–1.27)
GFR calc Af Amer: 112 mL/min/{1.73_m2} (ref >59)
GFR calc non Af Amer: 97 mL/min/{1.73_m2} (ref >59)
GLOBULIN, TOTAL: 2.3 g/dL (ref 1.5–4.5)
GLUCOSE: 89 mg/dL (ref 65–99)
Potassium: 5.3 mmol/L — ABNORMAL HIGH (ref 3.5–5.2)
SODIUM: 144 mmol/L (ref 134–144)
Total Protein: 6.7 g/dL (ref 6.0–8.5)

## 2015-09-25 LAB — VITAMIN D 25 HYDROXY (VIT D DEFICIENCY, FRACTURES): Vit D, 25-Hydroxy: 39 ng/mL (ref 30.0–100.0)

## 2015-09-25 LAB — HEPATITIS C ANTIBODY

## 2015-09-25 LAB — TSH: TSH: 1.47 u[IU]/mL (ref 0.450–4.500)

## 2015-11-19 ENCOUNTER — Ambulatory Visit (INDEPENDENT_AMBULATORY_CARE_PROVIDER_SITE_OTHER): Payer: BLUE CROSS/BLUE SHIELD | Admitting: Nurse Practitioner

## 2015-11-19 ENCOUNTER — Other Ambulatory Visit: Payer: Self-pay | Admitting: Nurse Practitioner

## 2015-11-19 ENCOUNTER — Encounter: Payer: Self-pay | Admitting: Nurse Practitioner

## 2015-11-19 ENCOUNTER — Ambulatory Visit (INDEPENDENT_AMBULATORY_CARE_PROVIDER_SITE_OTHER): Payer: BLUE CROSS/BLUE SHIELD

## 2015-11-19 VITALS — BP 142/81 | HR 49 | Temp 96.9°F | Ht 70.0 in | Wt 184.0 lb

## 2015-11-19 DIAGNOSIS — M189 Osteoarthritis of first carpometacarpal joint, unspecified: Secondary | ICD-10-CM | POA: Diagnosis not present

## 2015-11-19 DIAGNOSIS — M1811 Unilateral primary osteoarthritis of first carpometacarpal joint, right hand: Secondary | ICD-10-CM

## 2015-11-19 DIAGNOSIS — M79641 Pain in right hand: Secondary | ICD-10-CM

## 2015-11-19 MED ORDER — MELOXICAM 15 MG PO TABS: 15.0000 mg | ORAL_TABLET | Freq: Every day | ORAL | 3 refills | Status: DC

## 2015-11-19 MED ORDER — CIALIS 5 MG PO TABS: 5.0000 mg | ORAL_TABLET | Freq: Every day | ORAL | 5 refills | Status: DC

## 2015-11-19 NOTE — Progress Notes (Signed)
   Subjective:    Patient ID: Jimmy Bonilla, male    DOB: 02-06-1951, 65 y.o.   MRN: [id number]  HPIPatient in today c/o pain right hand- started several months ago- has history of hairline fracture in right first phalange. He thinks he aggravated it over the weekend. Rates pain  Over weekend 8/10.     Review of Systems  Constitutional: Negative.   Respiratory: Negative.   Gastrointestinal: Negative.   Genitourinary: Negative.   Neurological: Negative.   Psychiatric/Behavioral: Negative.   All other systems reviewed and are negative.      Objective:   Physical Exam  Constitutional: He is oriented to person, place, and time. He appears well-developed and well-nourished. No distress.  Cardiovascular: Normal rate, regular rhythm and normal heart sounds.   Pulmonary/Chest: Effort normal and breath sounds normal.  Musculoskeletal:  Pain in right first phalangeal area. Hurts to make fist- right grip slightly weaker then left.  Neurological: He is alert and oriented to person, place, and time.  Skin: Skin is warm.  Psychiatric: He has a normal mood and affect. His behavior is normal. Judgment and thought content normal.    BP (!) 142/81   Pulse (!) 49   Temp (!) 96.9 F (36.1 C) (Oral)   Ht 5\' 10"  (1.778 m)   Wt 184 lb (83.5 kg)   BMI 26.40 kg/m   Xray of right hand - Arthritic changes at base of carpal-metacarpal joint of thumb-Preliminary reading by Paulene Floor, FNP  [facility]]      Assessment & Plan:   1. Right hand pain   2. Osteoarthritis of carpometacarpal joint of right thumb, unspecified osteoarthritis type    Meds ordered this encounter  Medications  . meloxicam (MOBIC) 15 MG tablet    Sig: Take 1 tablet (15 mg total) by mouth daily.    Dispense:  30 tablet    Refill:  3    Order Specific Question:   Supervising Provider    Answer:   Oswaldo Done, CAROL L [[id number]]   Warm soaks Wrap when over using RTO prn  Mary-Margaret Daphine Deutscher, FNP

## 2015-11-22 ENCOUNTER — Telehealth: Payer: Self-pay

## 2015-11-22 NOTE — Telephone Encounter (Signed)
Please let patient know and see what he wants to do0 he is taking it for BPH and has been on it for awhile.

## 2015-12-04 NOTE — Telephone Encounter (Signed)
Patient is going to finish what he has and then he will call us back to see about changing to something different for his BPH.

## 2016-03-11 ENCOUNTER — Encounter: Payer: Self-pay | Admitting: Pediatrics

## 2016-03-11 ENCOUNTER — Ambulatory Visit (INDEPENDENT_AMBULATORY_CARE_PROVIDER_SITE_OTHER): Payer: BLUE CROSS/BLUE SHIELD | Admitting: Pediatrics

## 2016-03-11 VITALS — BP 131/66 | HR 51 | Temp 97.0°F | Ht 70.0 in | Wt 185.0 lb

## 2016-03-11 DIAGNOSIS — J069 Acute upper respiratory infection, unspecified: Secondary | ICD-10-CM

## 2016-03-11 NOTE — Patient Instructions (Signed)
For congestion: Netipot with distilled water 2-3 times a day to clear out sinuses Or Normal saline nasal spray Flonase steroid nasal spray Cetirizine or similar anti-histamine  Lots of fluids

## 2016-03-11 NOTE — Progress Notes (Signed)
Subjective:   Patient ID: Jimmy Bonilla, male    DOB: 07/11/1950, 65 y.o.   MRN: [id number] CC: URI (no fever, cough, congestion, hoarse, allergies? watery/itchy eyes)  HPI: Jimmy Bonilla is a 65 y.o. male presenting for URI (no fever, cough, congestion, hoarse, allergies? watery/itchy eyes)  Last few days hoarseness started Some congestion, cough now Started with runny nose 1 week ago No coughing No fevers Normal appetite  Relevant past medical, surgical, family and social history reviewed. Allergies and medications reviewed and updated. History  Smoking Status  . Never Smoker  Smokeless Tobacco  . Never Used   ROS: Per HPI   Objective:    BP 131/66   Pulse (!) 51   Temp 97 F (36.1 C) (Oral)   Ht 5\' 10"  (1.778 m)   Wt 185 lb (83.9 kg)   BMI 26.54 kg/m   Wt Readings from Last 3 Encounters:  03/11/16 185 lb (83.9 kg)  11/19/15 184 lb (83.5 kg)  09/24/15 184 lb 3.2 oz (83.6 kg)    Gen: NAD, alert, cooperative with exam, NCAT, hoarse voice EYES: EOMI, no conjunctival injection, or no icterus ENT:  R TM obscurred by cerumen, L TM normal, OP without erythema LYMPH: no cervical LAD CV: bradycardic, normal S1/S2 Resp: CTABL, no wheezes, normal WOB Neuro: Alert and oriented MSK: normal muscle bulk  Assessment & Plan:  Jimmy Bonilla was seen today for uri.  Diagnoses and all orders for this visit:  Acute URI Discussed symptomatic care, return precautions  Follow up plan: prn Rex Kras, MD [facility]

## 2016-03-14 ENCOUNTER — Telehealth: Payer: Self-pay | Admitting: Pediatrics

## 2016-03-14 ENCOUNTER — Other Ambulatory Visit: Payer: Self-pay | Admitting: Family Medicine

## 2016-03-14 MED ORDER — AMOXICILLIN-POT CLAVULANATE 875-125 MG PO TABS: 1.0000 | ORAL_TABLET | Freq: Two times a day (BID) | ORAL | 0 refills | Status: DC

## 2016-03-14 NOTE — Telephone Encounter (Signed)
I sent in the requested prescription 

## 2016-03-14 NOTE — Telephone Encounter (Signed)
Left message stating that Rx has been sent to pharmacy and to call back with any questions or concerns

## 2016-04-15 ENCOUNTER — Other Ambulatory Visit: Payer: Self-pay | Admitting: Nurse Practitioner

## 2016-07-24 ENCOUNTER — Other Ambulatory Visit: Payer: Self-pay | Admitting: Family Medicine

## 2016-07-24 NOTE — Telephone Encounter (Signed)
lipitor denied- NTBS

## 2016-07-24 NOTE — Telephone Encounter (Signed)
Last labs 09/2015. Next appointment 09/2016

## 2016-07-25 NOTE — Telephone Encounter (Signed)
Patient has appt for a physical in June.

## 2016-09-04 ENCOUNTER — Other Ambulatory Visit: Payer: Self-pay | Admitting: Family Medicine

## 2016-09-04 NOTE — Telephone Encounter (Signed)
Forward to PCP/MMM 

## 2016-09-23 ENCOUNTER — Ambulatory Visit (INDEPENDENT_AMBULATORY_CARE_PROVIDER_SITE_OTHER): Payer: BLUE CROSS/BLUE SHIELD | Admitting: Nurse Practitioner

## 2016-09-23 ENCOUNTER — Encounter: Payer: Self-pay | Admitting: Nurse Practitioner

## 2016-09-23 VITALS — BP 121/79 | HR 51 | Temp 96.9°F | Ht 68.0 in | Wt 183.0 lb

## 2016-09-23 DIAGNOSIS — I10 Essential (primary) hypertension: Secondary | ICD-10-CM | POA: Diagnosis not present

## 2016-09-23 DIAGNOSIS — E7800 Pure hypercholesterolemia, unspecified: Secondary | ICD-10-CM

## 2016-09-23 DIAGNOSIS — N400 Enlarged prostate without lower urinary tract symptoms: Secondary | ICD-10-CM

## 2016-09-23 MED ORDER — ATORVASTATIN CALCIUM 10 MG PO TABS: 10.0000 mg | ORAL_TABLET | Freq: Every day | ORAL | 1 refills | Status: DC

## 2016-09-23 MED ORDER — TAMSULOSIN HCL 0.4 MG PO CAPS: ORAL_CAPSULE | ORAL | 1 refills | Status: DC

## 2016-09-23 MED ORDER — CIALIS 5 MG PO TABS: 5.0000 mg | ORAL_TABLET | Freq: Every day | ORAL | 5 refills | Status: DC

## 2016-09-23 NOTE — Patient Instructions (Signed)
Fat and Cholesterol Restricted Diet Getting too much fat and cholesterol in your diet may cause health problems. Following this diet helps keep your fat and cholesterol at normal levels. This can keep you from getting sick. What types of fat should I choose?  Choose monosaturated and polyunsaturated fats. These are found in foods such as olive oil, canola oil, flaxseeds, walnuts, almonds, and seeds.  Eat more omega-3 fats. Good choices include salmon, mackerel, sardines, tuna, flaxseed oil, and ground flaxseeds.  Limit saturated fats. These are in animal products such as meats, butter, and cream. They can also be in plant products such as palm oil, palm kernel oil, and coconut oil.  Avoid foods with partially hydrogenated oils in them. These contain trans fats. Examples of foods that have trans fats are stick margarine, some tub margarines, cookies, crackers, and other baked goods. What general guidelines do I need to follow?  Check food labels. Look for the words "trans fat" and "saturated fat."  When preparing a meal: ? Fill half of your plate with vegetables and green salads. ? Fill one fourth of your plate with whole grains. Look for the word "whole" as the first word in the ingredient list. ? Fill one fourth of your plate with lean protein foods.  Eat more foods that have fiber, like apples, carrots, beans, peas, and barley.  Eat more home-cooked foods. Eat less at restaurants and buffets.  Limit or avoid alcohol.  Limit foods high in starch and sugar.  Limit fried foods.  Cook foods without frying them. Baking, boiling, grilling, and broiling are all great options.  Lose weight if you are overweight. Losing even a small amount of weight can help your overall health. It can also help prevent diseases such as diabetes and heart disease. What foods can I eat? Grains Whole grains, such as whole wheat or whole grain breads, crackers, cereals, and pasta. Unsweetened oatmeal,  bulgur, barley, quinoa, or brown rice. Corn or whole wheat flour tortillas. Vegetables Fresh or frozen vegetables (raw, steamed, roasted, or grilled). Green salads. Fruits All fresh, canned (in natural juice), or frozen fruits. Meat and Other Protein Products Ground beef (85% or leaner), grass-fed beef, or beef trimmed of fat. Skinless chicken or turkey. Ground chicken or turkey. Pork trimmed of fat. All fish and seafood. Eggs. Dried beans, peas, or lentils. Unsalted nuts or seeds. Unsalted canned or dry beans. Dairy Low-fat dairy products, such as skim or 1% milk, 2% or reduced-fat cheeses, low-fat ricotta or cottage cheese, or plain low-fat yogurt. Fats and Oils Tub margarines without trans fats. Light or reduced-fat mayonnaise and salad dressings. Avocado. Olive, canola, sesame, or safflower oils. Natural peanut or almond butter (choose ones without added sugar and oil). The items listed above may not be a complete list of recommended foods or beverages. Contact your dietitian for more options. What foods are not recommended? Grains White bread. White pasta. White rice. Cornbread. Bagels, pastries, and croissants. Crackers that contain trans fat. Vegetables White potatoes. Corn. Creamed or fried vegetables. Vegetables in a cheese sauce. Fruits Dried fruits. Canned fruit in light or heavy syrup. Fruit juice. Meat and Other Protein Products Fatty cuts of meat. Ribs, chicken wings, bacon, sausage, bologna, salami, chitterlings, fatback, hot dogs, bratwurst, and packaged luncheon meats. Liver and organ meats. Dairy Whole or 2% milk, cream, half-and-half, and cream cheese. Whole milk cheeses. Whole-fat or sweetened yogurt. Full-fat cheeses. Nondairy creamers and whipped toppings. Processed cheese, cheese spreads, or cheese curds. Sweets and Desserts Corn   syrup, sugars, honey, and molasses. Candy. Jam and jelly. Syrup. Sweetened cereals. Cookies, pies, cakes, donuts, muffins, and ice  cream. Fats and Oils Butter, stick margarine, lard, shortening, ghee, or bacon fat. Coconut, palm kernel, or palm oils. Beverages Alcohol. Sweetened drinks (such as sodas, lemonade, and fruit drinks or punches). The items listed above may not be a complete list of foods and beverages to avoid. Contact your dietitian for more information. This information is not intended to replace advice given to you by your health care provider. Make sure you discuss any questions you have with your health care provider. Document Released: 10/07/2011 Document Revised: 12/13/2015 Document Reviewed: 07/07/2013 Elsevier Interactive Patient Education  2018 Elsevier Inc.  

## 2016-09-23 NOTE — Progress Notes (Signed)
Subjective:    Patient ID: Jimmy Bonilla, male    DOB: 07-Mar-1951, 66 y.o.   MRN: [id number]  HPI  Jimmy Bonilla is here today for follow up of chronic medical problem and [employer] physical..  Outpatient Encounter Prescriptions as of 09/23/2016  Medication Sig  . atorvastatin (LIPITOR) 10 MG tablet Take 1 tablet (10 mg total) by mouth daily.  Marland Kitchen CIALIS 5 MG tablet Take 1 tablet (5 mg total) by mouth daily.  . Multiple Vitamins-Minerals (CENTRUM SILVER PO) Take 1 tablet by mouth.  . tamsulosin (FLOMAX) 0.4 MG CAPS capsule TAKE ONE (1) CAPSULE EACH DAY     1. Essential hypertension  Currently on no meds- denies chest pain,SOB or HA- does not check blood pressure at home  2. Benign prostatic hyperplasia without lower urinary tract symptoms  Currently on flomax- no trouble with stream  3. Pure hypercholesterolemia  Tries to watch diet- takes lipitor daily    New complaints: None today     Review of Systems  Constitutional: Negative for diaphoresis.  HENT: Negative.   Eyes: Negative for pain.  Respiratory: Negative for shortness of breath.   Cardiovascular: Negative for chest pain, palpitations and leg swelling.  Gastrointestinal: Negative for abdominal pain.  Endocrine: Negative for polydipsia.  Skin: Negative for rash.  Neurological: Negative for dizziness, weakness and headaches.  Hematological: Does not bruise/bleed easily.  Psychiatric/Behavioral: Negative.   All other systems reviewed and are negative.      Objective:   Physical Exam  Constitutional: He is oriented to person, place, and time. He appears well-developed and well-nourished.  HENT:  Head: Normocephalic.  Right Ear: External ear normal.  Left Ear: External ear normal.  Nose: Nose normal.  Mouth/Throat: Oropharynx is clear and moist.  Eyes: EOM are normal. Pupils are equal, round, and reactive to light.  Neck: Normal range of motion. Neck supple. No JVD present. No thyromegaly present.    Cardiovascular: Normal rate, regular rhythm, normal heart sounds and intact distal pulses.  Exam reveals no gallop and no friction rub.   No murmur heard. Pulmonary/Chest: Effort normal and breath sounds normal. No respiratory distress. He has no wheezes. He has no rales. He exhibits no tenderness.  Abdominal: Soft. Bowel sounds are normal. He exhibits no mass. There is no tenderness.  Genitourinary: Prostate normal and penis normal.  Musculoskeletal: Normal range of motion. He exhibits no edema.  Lymphadenopathy:    He has no cervical adenopathy.  Neurological: He is alert and oriented to person, place, and time. No cranial nerve deficit.  Skin: Skin is warm and dry.  Psychiatric: He has a normal mood and affect. His behavior is normal. Judgment and thought content normal.   BP 121/79   Pulse (!) 51   Temp (!) 96.9 F (36.1 C) (Oral)   Ht '5\' 8"'$  (1.727 m)   Wt 183 lb (83 kg)   BMI 27.83 kg/m       Assessment & Plan:  1. Essential hypertension Low sodium diet - CMP14+EGFR  2. Benign prostatic hyperplasia without lower urinary tract symptoms - tamsulosin (FLOMAX) 0.4 MG CAPS capsule; TAKE ONE (1) CAPSULE EACH DAY  Dispense: 90 capsule; Refill: 1 - PSA, total and free  3. Pure hypercholesterolemia Low fta diet - atorvastatin (LIPITOR) 10 MG tablet; Take 1 tablet (10 mg total) by mouth daily.  Dispense: 90 tablet; Refill: 1 - Lipid panel    Labs pending Health maintenance reviewed Diet and exercise encouraged Continue all meds Follow up  In 6 months   [place], FNP

## 2016-09-24 ENCOUNTER — Telehealth: Payer: Self-pay | Admitting: Nurse Practitioner

## 2016-09-24 LAB — LIPID PANEL
CHOLESTEROL TOTAL: 135 mg/dL (ref 100–199)
Chol/HDL Ratio: 3.5 ratio (ref 0.0–5.0)
HDL: 39 mg/dL — ABNORMAL LOW (ref >39)
LDL CALC: 76 mg/dL (ref 0–99)
TRIGLYCERIDES: 102 mg/dL (ref 0–149)
VLDL CHOLESTEROL CAL: 20 mg/dL (ref 5–40)

## 2016-09-24 LAB — CMP14+EGFR
ALBUMIN: 4.5 g/dL (ref 3.6–4.8)
ALK PHOS: 96 IU/L (ref 39–117)
ALT: 8 IU/L (ref 0–44)
AST: 18 IU/L (ref 0–40)
Albumin/Globulin Ratio: 1.9 (ref 1.2–2.2)
BUN / CREAT RATIO: 13 (ref 10–24)
BUN: 13 mg/dL (ref 8–27)
Bilirubin Total: 0.8 mg/dL (ref 0.0–1.2)
CO2: 24 mmol/L (ref 18–29)
CREATININE: 0.99 mg/dL (ref 0.76–1.27)
Calcium: 9.5 mg/dL (ref 8.6–10.2)
Chloride: 101 mmol/L (ref 96–106)
GFR calc Af Amer: 93 mL/min/{1.73_m2} (ref >59)
GFR calc non Af Amer: 80 mL/min/{1.73_m2} (ref >59)
GLUCOSE: 86 mg/dL (ref 65–99)
Globulin, Total: 2.4 g/dL (ref 1.5–4.5)
Potassium: 4.4 mmol/L (ref 3.5–5.2)
Sodium: 142 mmol/L (ref 134–144)
Total Protein: 6.9 g/dL (ref 6.0–8.5)

## 2016-09-24 LAB — PSA, TOTAL AND FREE
PSA FREE PCT: 21.6 %
PSA, Free: 1.66 ng/mL
Prostate Specific Ag, Serum: 7.7 ng/mL — ABNORMAL HIGH (ref 0.0–4.0)

## 2016-09-24 NOTE — Telephone Encounter (Signed)
done

## 2016-09-24 NOTE — Telephone Encounter (Signed)
What is the name of the medication? lipitor generic  Have you contacted your pharmacy to request a refill? yes  Which pharmacy would you like this sent to? The drug store in [place].   Patient notified that their request is being sent to the clinical staff for review and that they should receive a call once it is complete. If they do not receive a call within 24 hours they can check with their pharmacy or our office.

## 2016-10-21 ENCOUNTER — Ambulatory Visit (INDEPENDENT_AMBULATORY_CARE_PROVIDER_SITE_OTHER): Payer: Commercial Managed Care - PPO | Admitting: Pediatrics

## 2016-10-21 ENCOUNTER — Encounter: Payer: Self-pay | Admitting: Pediatrics

## 2016-10-21 VITALS — BP 141/88 | HR 48 | Temp 97.1°F | Ht 68.0 in | Wt 187.0 lb

## 2016-10-21 DIAGNOSIS — H65111 Acute and subacute allergic otitis media (mucoid) (sanguinous) (serous), right ear: Secondary | ICD-10-CM

## 2016-10-21 DIAGNOSIS — R030 Elevated blood-pressure reading, without diagnosis of hypertension: Secondary | ICD-10-CM

## 2016-10-21 MED ORDER — AMOXICILLIN 500 MG PO CAPS: 500.0000 mg | ORAL_CAPSULE | Freq: Two times a day (BID) | ORAL | 0 refills | Status: DC

## 2016-10-21 NOTE — Patient Instructions (Signed)
Debrox drops twice a week

## 2016-10-21 NOTE — Progress Notes (Signed)
Subjective:   Patient ID: Jimmy Bonilla, male    DOB: 05/30/1950, 66 y.o.   MRN: [id number] CC: Otalgia (right ear pain and swollen lymph node)  HPI: Jimmy Bonilla is a 66 y.o. male presenting for Otalgia (right ear pain and swollen lymph node)  Came back from summer camp 1 week ago A few days ago started having R ear pain Was having some tooth pain as well Took three days of amoxicillin he had at home with some improvement in tooth pain Ear pain got worse No fevers Normal appetite  No CP or SOB or HA Not on BP meds Has checked Bps in the past, not checking at home regularly  Relevant past medical, surgical, family and social history reviewed. Allergies and medications reviewed and updated. History  Smoking Status  . Never Smoker  Smokeless Tobacco  . Never Used   ROS: Per HPI   Objective:    BP (!) 141/88 (BP Location: Left Arm, Patient Position: Sitting, Cuff Size: Normal)   Pulse (!) 48   Temp (!) 97.1 F (36.2 C) (Oral)   Ht 5\' 8"  (1.727 m)   Wt 187 lb (84.8 kg)   BMI 28.43 kg/m   Wt Readings from Last 3 Encounters:  10/21/16 187 lb (84.8 kg)  09/23/16 183 lb (83 kg)  03/11/16 185 lb (83.9 kg)    Gen: NAD, alert, cooperative with exam, NCAT EYES: EOMI, no conjunctival injection, or no icterus ENT:  R TM with impacted cerumen, removed with curette, TM pink, clear effusion, L TM gray, nl LR, OP without erythema LYMPH: R ant cervical LN apprx 1 cm CV: NRRR, normal S1/S2, no murmur, distal pulses 2+ b/l Resp: CTABL, no wheezes, normal WOB Abd: +BS, soft, NTND. no guarding or organomegaly Ext: No edema, warm Neuro: Alert and oriented  Assessment & Plan:  Jimmy Bonilla was seen today for otalgia.  Diagnoses and all orders for this visit:  Acute mucoid otitis media of right ear Take below Use flonase, antihistamines If not improving let me know -     amoxicillin (AMOXIL) 500 MG capsule; Take 1 capsule (500 mg total) by mouth 2 (two) times daily.  Elevated blood  pressure reading Check BPs at home Goal 120s/70s Let us know if regularly elevated  Follow up plan: 6 mo Rex Kras, MD [facility]

## 2017-01-26 DIAGNOSIS — Z23 Encounter for immunization: Secondary | ICD-10-CM | POA: Diagnosis not present

## 2017-02-09 DIAGNOSIS — B351 Tinea unguium: Secondary | ICD-10-CM | POA: Diagnosis not present

## 2017-02-23 DIAGNOSIS — H33113 Cyst of ora serrata, bilateral: Secondary | ICD-10-CM | POA: Diagnosis not present

## 2017-02-23 DIAGNOSIS — H43811 Vitreous degeneration, right eye: Secondary | ICD-10-CM | POA: Diagnosis not present

## 2017-02-23 DIAGNOSIS — H2511 Age-related nuclear cataract, right eye: Secondary | ICD-10-CM | POA: Diagnosis not present

## 2017-04-08 DIAGNOSIS — B351 Tinea unguium: Secondary | ICD-10-CM | POA: Diagnosis not present

## 2017-05-13 DIAGNOSIS — B351 Tinea unguium: Secondary | ICD-10-CM | POA: Diagnosis not present

## 2017-06-06 IMAGING — DX DG HAND COMPLETE 3+V*R*
3 series · 3 of 3 positions shown · non-contrast
Comparison: None.

CLINICAL DATA: Fall 1 year ago.  Continued pain in palm of hand.

EXAM:
RIGHT HAND - COMPLETE 3+ VIEW

[hand pa]
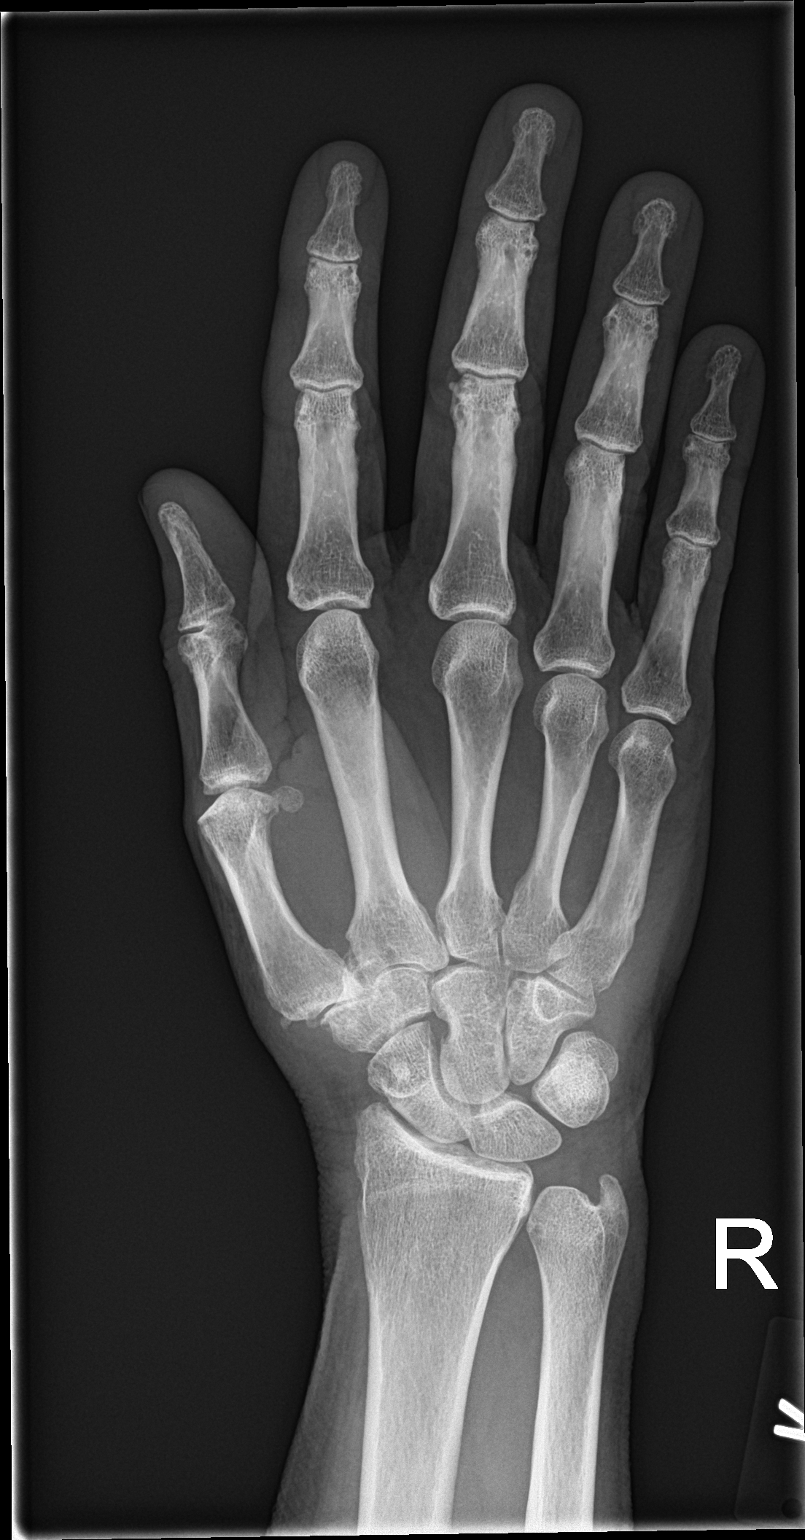

[hand obl]
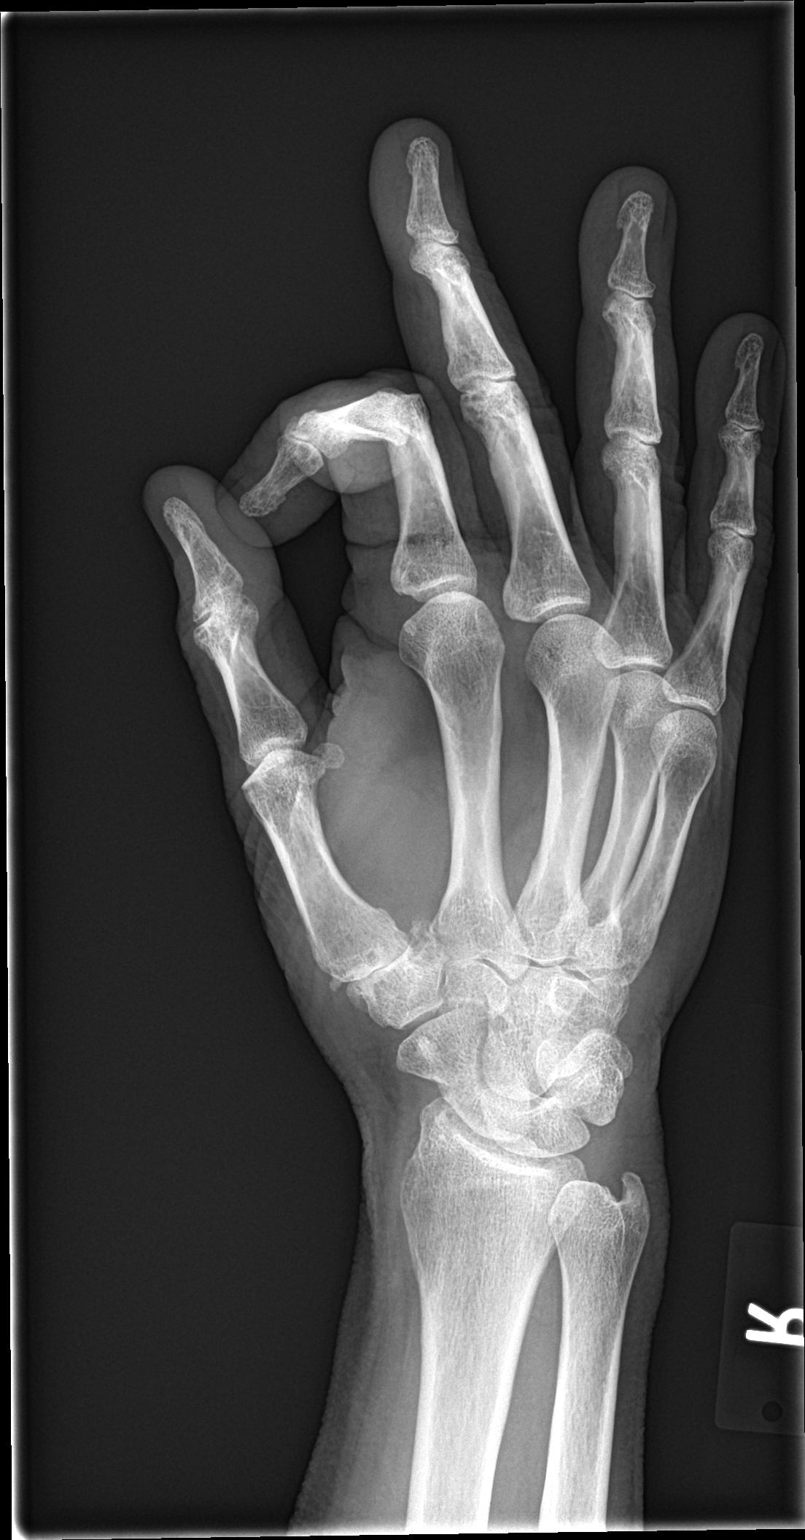

[hand lat]
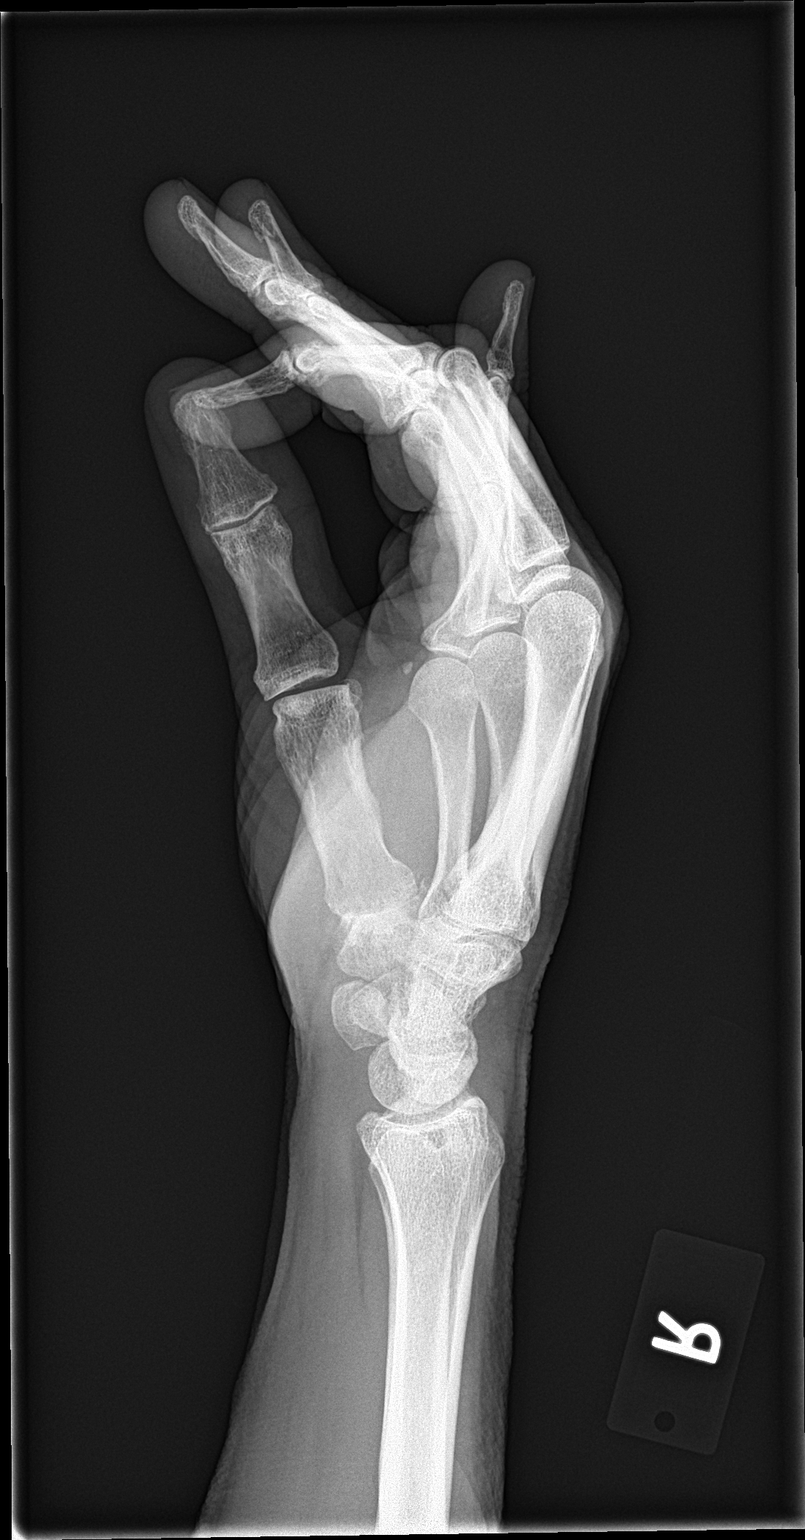

[3 of 3 positions shown; findings below may reference images not displayed]

FINDINGS: Advanced degenerative changes are present at the first CMC joint
with lateral subluxation of the joint. Moderate degenerative changes
are present in the DIP joints predominantly. There are also some
degenerative changes in the PIP joint of the long finger.
Subchondral cystic changes are noted without significant erosions.

No acute fracture is present. There is no evidence for significant
remote trauma.
IMPRESSION: 1. Moderate degenerative changes throughout the hand as described
above.
2. Significant degenerative change and subluxation at the first CMC
joint.
3. No evidence for significant acute or remote trauma.

## 2017-06-08 DIAGNOSIS — N528 Other male erectile dysfunction: Secondary | ICD-10-CM | POA: Diagnosis not present

## 2017-06-08 DIAGNOSIS — R972 Elevated prostate specific antigen [PSA]: Secondary | ICD-10-CM | POA: Diagnosis not present

## 2017-06-08 DIAGNOSIS — N200 Calculus of kidney: Secondary | ICD-10-CM | POA: Diagnosis not present

## 2017-06-08 DIAGNOSIS — N138 Other obstructive and reflux uropathy: Secondary | ICD-10-CM | POA: Diagnosis not present

## 2017-06-08 DIAGNOSIS — N401 Enlarged prostate with lower urinary tract symptoms: Secondary | ICD-10-CM | POA: Diagnosis not present

## 2017-06-24 DIAGNOSIS — R972 Elevated prostate specific antigen [PSA]: Secondary | ICD-10-CM | POA: Diagnosis not present

## 2017-07-21 ENCOUNTER — Other Ambulatory Visit: Payer: Self-pay | Admitting: Nurse Practitioner

## 2017-07-21 DIAGNOSIS — N400 Enlarged prostate without lower urinary tract symptoms: Secondary | ICD-10-CM

## 2017-10-13 ENCOUNTER — Other Ambulatory Visit: Payer: Self-pay | Admitting: Nurse Practitioner

## 2017-10-13 DIAGNOSIS — E7800 Pure hypercholesterolemia, unspecified: Secondary | ICD-10-CM

## 2017-10-14 ENCOUNTER — Telehealth: Payer: Self-pay | Admitting: Nurse Practitioner

## 2017-10-14 DIAGNOSIS — E7800 Pure hypercholesterolemia, unspecified: Secondary | ICD-10-CM

## 2017-10-14 NOTE — Telephone Encounter (Signed)
Please advise 

## 2017-10-15 MED ORDER — ATORVASTATIN CALCIUM 10 MG PO TABS: 10.0000 mg | ORAL_TABLET | Freq: Every day | ORAL | 1 refills | Status: DC

## 2017-10-29 ENCOUNTER — Ambulatory Visit: Payer: Medicare HMO | Admitting: Nurse Practitioner

## 2017-10-29 ENCOUNTER — Encounter: Payer: Self-pay | Admitting: Nurse Practitioner

## 2017-10-29 ENCOUNTER — Ambulatory Visit: Payer: Commercial Managed Care - PPO | Admitting: Nurse Practitioner

## 2017-10-29 VITALS — BP 117/76 | HR 55 | Temp 97.0°F | Ht 70.0 in | Wt 183.4 lb

## 2017-10-29 DIAGNOSIS — N529 Male erectile dysfunction, unspecified: Secondary | ICD-10-CM

## 2017-10-29 DIAGNOSIS — E7800 Pure hypercholesterolemia, unspecified: Secondary | ICD-10-CM

## 2017-10-29 DIAGNOSIS — N400 Enlarged prostate without lower urinary tract symptoms: Secondary | ICD-10-CM | POA: Diagnosis not present

## 2017-10-29 DIAGNOSIS — I10 Essential (primary) hypertension: Secondary | ICD-10-CM

## 2017-10-29 DIAGNOSIS — Z23 Encounter for immunization: Secondary | ICD-10-CM | POA: Diagnosis not present

## 2017-10-29 LAB — LIPID PANEL
CHOLESTEROL TOTAL: 145 mg/dL (ref 100–199)
Chol/HDL Ratio: 3.6 ratio (ref 0.0–5.0)
HDL: 40 mg/dL (ref >39)
LDL CALC: 85 mg/dL (ref 0–99)
Triglycerides: 102 mg/dL (ref 0–149)
VLDL CHOLESTEROL CAL: 20 mg/dL (ref 5–40)

## 2017-10-29 LAB — CMP14+EGFR
ALBUMIN: 4.2 g/dL (ref 3.6–4.8)
ALK PHOS: 88 IU/L (ref 39–117)
ALT: 8 IU/L (ref 0–44)
AST: 15 IU/L (ref 0–40)
Albumin/Globulin Ratio: 1.7 (ref 1.2–2.2)
BUN / CREAT RATIO: 13 (ref 10–24)
BUN: 13 mg/dL (ref 8–27)
Bilirubin Total: 0.6 mg/dL (ref 0.0–1.2)
CO2: 25 mmol/L (ref 20–29)
CREATININE: 1.01 mg/dL (ref 0.76–1.27)
Calcium: 9.3 mg/dL (ref 8.6–10.2)
Chloride: 105 mmol/L (ref 96–106)
GFR calc non Af Amer: 78 mL/min/{1.73_m2} (ref >59)
GFR, EST AFRICAN AMERICAN: 90 mL/min/{1.73_m2} (ref >59)
Globulin, Total: 2.5 g/dL (ref 1.5–4.5)
Glucose: 88 mg/dL (ref 65–99)
Potassium: 4.3 mmol/L (ref 3.5–5.2)
Sodium: 144 mmol/L (ref 134–144)
TOTAL PROTEIN: 6.7 g/dL (ref 6.0–8.5)

## 2017-10-29 MED ORDER — ATORVASTATIN CALCIUM 10 MG PO TABS: 10.0000 mg | ORAL_TABLET | Freq: Every day | ORAL | 1 refills | Status: DC

## 2017-10-29 MED ORDER — SILDENAFIL CITRATE 20 MG PO TABS: 20.0000 mg | ORAL_TABLET | Freq: Three times a day (TID) | ORAL | 2 refills | Status: DC

## 2017-10-29 NOTE — Addendum Note (Signed)
Addended by: Bennie PieriniMARTIN, MARY-MARGARET on: 10/29/2017 10:06 AM   Modules accepted: Orders

## 2017-10-29 NOTE — Addendum Note (Signed)
Addended by: Cleda DaubUCKER, AMANDA G on: 10/29/2017 03:27 PM   Modules accepted: Orders

## 2017-10-29 NOTE — Patient Instructions (Signed)
DASH Eating Plan DASH stands for "Dietary Approaches to Stop Hypertension." The DASH eating plan is a healthy eating plan that has been shown to reduce high blood pressure (hypertension). It may also reduce your risk for type 2 diabetes, heart disease, and stroke. The DASH eating plan may also help with weight loss. What are tips for following this plan? General guidelines  Avoid eating more than 2,300 mg (milligrams) of salt (sodium) a day. If you have hypertension, you may need to reduce your sodium intake to 1,500 mg a day.  Limit alcohol intake to no more than 1 drink a day for nonpregnant women and 2 drinks a day for men. One drink equals 12 oz of beer, 5 oz of wine, or 1 oz of hard liquor.  Work with your health care provider to maintain a healthy body weight or to lose weight. Ask what an ideal weight is for you.  Get at least 30 minutes of exercise that causes your heart to beat faster (aerobic exercise) most days of the week. Activities may include walking, swimming, or biking.  Work with your health care provider or diet and nutrition specialist (dietitian) to adjust your eating plan to your individual calorie needs. Reading food labels  Check food labels for the amount of sodium per serving. Choose foods with less than 5 percent of the Daily Value of sodium. Generally, foods with less than 300 mg of sodium per serving fit into this eating plan.  To find whole grains, look for the word "whole" as the first word in the ingredient list. Shopping  Buy products labeled as "low-sodium" or "no salt added."  Buy fresh foods. Avoid canned foods and premade or frozen meals. Cooking  Avoid adding salt when cooking. Use salt-free seasonings or herbs instead of table salt or sea salt. Check with your health care provider or pharmacist before using salt substitutes.  Do not fry foods. Cook foods using healthy methods such as baking, boiling, grilling, and broiling instead.  Cook with  heart-healthy oils, such as olive, canola, soybean, or sunflower oil. Meal planning   Eat a balanced diet that includes: ? 5 or more servings of fruits and vegetables each day. At each meal, try to fill half of your plate with fruits and vegetables. ? Up to 6-8 servings of whole grains each day. ? Less than 6 oz of lean meat, poultry, or fish each day. A 3-oz serving of meat is about the same size as a deck of cards. One egg equals 1 oz. ? 2 servings of low-fat dairy each day. ? A serving of nuts, seeds, or beans 5 times each week. ? Heart-healthy fats. Healthy fats called Omega-3 fatty acids are found in foods such as flaxseeds and coldwater fish, like sardines, salmon, and mackerel.  Limit how much you eat of the following: ? Canned or prepackaged foods. ? Food that is high in trans fat, such as fried foods. ? Food that is high in saturated fat, such as fatty meat. ? Sweets, desserts, sugary drinks, and other foods with added sugar. ? Full-fat dairy products.  Do not salt foods before eating.  Try to eat at least 2 vegetarian meals each week.  Eat more home-cooked food and less restaurant, buffet, and fast food.  When eating at a restaurant, ask that your food be prepared with less salt or no salt, if possible. What foods are recommended? The items listed may not be a complete list. Talk with your dietitian about what   dietary choices are best for you. Grains Whole-grain or whole-wheat bread. Whole-grain or whole-wheat pasta. Brown rice. Oatmeal. Quinoa. Bulgur. Whole-grain and low-sodium cereals. Pita bread. Low-fat, low-sodium crackers. Whole-wheat flour tortillas. Vegetables Fresh or frozen vegetables (raw, steamed, roasted, or grilled). Low-sodium or reduced-sodium tomato and vegetable juice. Low-sodium or reduced-sodium tomato sauce and tomato paste. Low-sodium or reduced-sodium canned vegetables. Fruits All fresh, dried, or frozen fruit. Canned fruit in natural juice (without  added sugar). Meat and other protein foods Skinless chicken or turkey. Ground chicken or turkey. Pork with fat trimmed off. Fish and seafood. Egg whites. Dried beans, peas, or lentils. Unsalted nuts, nut butters, and seeds. Unsalted canned beans. Lean cuts of beef with fat trimmed off. Low-sodium, lean deli meat. Dairy Low-fat (1%) or fat-free (skim) milk. Fat-free, low-fat, or reduced-fat cheeses. Nonfat, low-sodium ricotta or cottage cheese. Low-fat or nonfat yogurt. Low-fat, low-sodium cheese. Fats and oils Soft margarine without trans fats. Vegetable oil. Low-fat, reduced-fat, or light mayonnaise and salad dressings (reduced-sodium). Canola, safflower, olive, soybean, and sunflower oils. Avocado. Seasoning and other foods Herbs. Spices. Seasoning mixes without salt. Unsalted popcorn and pretzels. Fat-free sweets. What foods are not recommended? The items listed may not be a complete list. Talk with your dietitian about what dietary choices are best for you. Grains Baked goods made with fat, such as croissants, muffins, or some breads. Dry pasta or rice meal packs. Vegetables Creamed or fried vegetables. Vegetables in a cheese sauce. Regular canned vegetables (not low-sodium or reduced-sodium). Regular canned tomato sauce and paste (not low-sodium or reduced-sodium). Regular tomato and vegetable juice (not low-sodium or reduced-sodium). Pickles. Olives. Fruits Canned fruit in a light or heavy syrup. Fried fruit. Fruit in cream or butter sauce. Meat and other protein foods Fatty cuts of meat. Ribs. Fried meat. Bacon. Sausage. Bologna and other processed lunch meats. Salami. Fatback. Hotdogs. Bratwurst. Salted nuts and seeds. Canned beans with added salt. Canned or smoked fish. Whole eggs or egg yolks. Chicken or turkey with skin. Dairy Whole or 2% milk, cream, and half-and-half. Whole or full-fat cream cheese. Whole-fat or sweetened yogurt. Full-fat cheese. Nondairy creamers. Whipped toppings.  Processed cheese and cheese spreads. Fats and oils Butter. Stick margarine. Lard. Shortening. Ghee. Bacon fat. Tropical oils, such as coconut, palm kernel, or palm oil. Seasoning and other foods Salted popcorn and pretzels. Onion salt, garlic salt, seasoned salt, table salt, and sea salt. Worcestershire sauce. Tartar sauce. Barbecue sauce. Teriyaki sauce. Soy sauce, including reduced-sodium. Steak sauce. Canned and packaged gravies. Fish sauce. Oyster sauce. Cocktail sauce. Horseradish that you find on the shelf. Ketchup. Mustard. Meat flavorings and tenderizers. Bouillon cubes. Hot sauce and Tabasco sauce. Premade or packaged marinades. Premade or packaged taco seasonings. Relishes. Regular salad dressings. Where to find more information:  National Heart, Lung, and Blood Institute: www.nhlbi.nih.gov  American Heart Association: www.heart.org Summary  The DASH eating plan is a healthy eating plan that has been shown to reduce high blood pressure (hypertension). It may also reduce your risk for type 2 diabetes, heart disease, and stroke.  With the DASH eating plan, you should limit salt (sodium) intake to 2,300 mg a day. If you have hypertension, you may need to reduce your sodium intake to 1,500 mg a day.  When on the DASH eating plan, aim to eat more fresh fruits and vegetables, whole grains, lean proteins, low-fat dairy, and heart-healthy fats.  Work with your health care provider or diet and nutrition specialist (dietitian) to adjust your eating plan to your individual   calorie needs. This information is not intended to replace advice given to you by your health care provider. Make sure you discuss any questions you have with your health care provider. Document Released: 03/27/2011 Document Revised: 03/31/2016 Document Reviewed: 03/31/2016 Elsevier Interactive Patient Education  2018 Elsevier Inc.  

## 2017-10-29 NOTE — Progress Notes (Signed)
Subjective:    Patient ID: Jimmy Bonilla, male    DOB: 13-May-1950, 67 y.o.   MRN: [id number]   Chief Complaint:  Medical Management of Chronic issues  HPI:  1. Essential hypertension  No c/o chest pain, sob or headache. Does not check blood pressure. BP Readings from Last 3 Encounters:  10/21/16 (!) 141/88  09/23/16 121/79  03/11/16 131/66     2. Benign prostatic hyperplasia without lower urinary tract symptoms  Saw Dr. Rosana Hoes in November and he was doing well. No problems voiding.  3. Pure hypercholesterolemia  Watches diet closely and stays very active.  4.     Erectile dysfunction         Has been using cialis and does not like how it makes him feel. Would like         to go back to sildenafil.  Outpatient Encounter Medications as of 10/29/2017  Medication Sig  . amoxicillin (AMOXIL) 500 MG capsule Take 1 capsule (500 mg total) by mouth 2 (two) times daily.  Marland Kitchen atorvastatin (LIPITOR) 10 MG tablet Take 1 tablet (10 mg total) by mouth daily.  Marland Kitchen CIALIS 5 MG tablet Take 1 tablet (5 mg total) by mouth daily.  . Multiple Vitamins-Minerals (CENTRUM SILVER PO) Take 1 tablet by mouth.  . tamsulosin (FLOMAX) 0.4 MG CAPS capsule TAKE ONE (1) CAPSULE EACH DAY       New complaints: None  today  Social history: Wife still living. The yare both very active. 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  HENT: Negative.   Eyes: Negative for pain.  Respiratory: Negative for shortness of breath.   Cardiovascular: Negative for chest pain, palpitations and leg swelling.  Gastrointestinal: Negative for abdominal pain.  Endocrine: Negative for polydipsia.  Genitourinary: Negative.   Skin: Negative for rash.  Neurological: Negative for dizziness, weakness and headaches.  Hematological: Does not bruise/bleed easily.  Psychiatric/Behavioral: Negative.   All other systems reviewed and are negative.        Objective:   Physical Exam  Constitutional: He is oriented to person, place, and time. He appears well-developed and well-nourished.  HENT:  Head: Normocephalic.  Nose: Nose normal.  Mouth/Throat: Oropharynx is clear and moist.  Eyes: Pupils are equal, round, and reactive to light. EOM are normal.  Neck: Normal range of motion and phonation normal. Neck supple. No JVD present. Carotid bruit is not present. No thyroid mass and no thyromegaly present.  Cardiovascular: Normal rate and regular rhythm.  Pulmonary/Chest: Effort normal and breath sounds normal. No respiratory distress.  Abdominal: Soft. Normal appearance, normal aorta and bowel sounds are normal. There is no tenderness.  Musculoskeletal: Normal range of motion.  Lymphadenopathy:    He has no cervical adenopathy.  Neurological: He is alert and oriented to person, place, and time.  Skin: Skin is warm and dry.  Psychiatric: He has a normal mood and affect. His behavior is normal. Judgment and thought content normal.  Nursing note and vitals reviewed.   BP 117/76   Pulse (!) 55   Temp (!) 97 F (36.1 C) (Oral)   Ht 5' 10"  (1.778 m)   Wt 183 lb 6.4 oz (83.2 kg)   BMI 26.32 kg/m         Assessment & Plan:  Jimmy Bonilla comes in today with chief complaint of Medical Management of Chronic Issues   Diagnosis and orders addressed:  1. Essential  hypertension Low sodium diet - CMP14+EGFR  2. Benign prostatic hyperplasia without lower urinary tract symptoms Keep follow up with urology  3. Pure hypercholesterolemia Low fat diet - atorvastatin (LIPITOR) 10 MG tablet; Take 1 tablet (10 mg total) by mouth daily.  Dispense: 90 tablet; Refill: 1 - Lipid panel  4. Erectile dysfunction, unspecified erectile dysfunction type Changed from cialis to sildenafil - sildenafil (REVATIO) 20 MG tablet; Take 1 tablet (20 mg total) by mouth 3 (three) times daily.  Dispense: 50 tablet; Refill: 2   Labs pending Health  Maintenance reviewed Diet and exercise encouraged  Follow up plan: 3 months   Mary-Margaret Hassell Done, FNP

## 2017-10-31 LAB — SPECIMEN STATUS REPORT

## 2017-10-31 LAB — PSA, TOTAL AND FREE
PSA FREE PCT: 20.7 %
PSA, Free: 1.57 ng/mL
Prostate Specific Ag, Serum: 7.6 ng/mL — ABNORMAL HIGH (ref 0.0–4.0)

## 2018-01-13 ENCOUNTER — Encounter: Payer: Self-pay | Admitting: Physician Assistant

## 2018-01-13 ENCOUNTER — Ambulatory Visit: Payer: Medicare HMO | Admitting: Physician Assistant

## 2018-01-13 VITALS — BP 134/88 | HR 53 | Temp 97.4°F | Ht 68.0 in | Wt 184.0 lb

## 2018-01-13 DIAGNOSIS — L237 Allergic contact dermatitis due to plants, except food: Secondary | ICD-10-CM | POA: Diagnosis not present

## 2018-01-13 DIAGNOSIS — M9901 Segmental and somatic dysfunction of cervical region: Secondary | ICD-10-CM

## 2018-01-13 MED ORDER — METHYLPREDNISOLONE ACETATE 80 MG/ML IJ SUSP
80.0000 mg | Freq: Once | INTRAMUSCULAR | Status: AC
Start: 2018-01-13 — End: 2018-01-13
  Administered 2018-01-13: 80 mg via INTRAMUSCULAR

## 2018-01-13 NOTE — Patient Instructions (Addendum)
Neck Exercises Neck exercises can be important for many reasons:  They can help you to improve and maintain flexibility in your neck. This can be especially important as you age.  They can help to make your neck stronger. This can make movement easier.  They can reduce or prevent neck pain.  They may help your upper back.  Ask your health care provider which neck exercises would be best for you. Exercises Neck Press Repeat this exercise 10 times. Do it first thing in the morning and right before bed or as told by your health care provider. 1. Lie on your back on a firm bed or on the floor with a pillow under your head. 2. Use your neck muscles to push your head down on the pillow and straighten your spine. 3. Hold the position as well as you can. Keep your head facing up and your chin tucked. 4. Slowly count to 5 while holding this position. 5. Relax for a few seconds. Then repeat.  Isometric Strengthening Do a full set of these exercises 2 times a day or as told by your health care provider. 1. Sit in a supportive chair and place your hand on your forehead. 2. Push forward with your head and neck while pushing back with your hand. Hold for 10 seconds. 3. Relax. Then repeat the exercise 3 times. 4. Next, do thesequence again, this time putting your hand against the back of your head. Use your head and neck to push backward against the hand pressure. 5. Finally, do the same exercise on either side of your head, pushing sideways against the pressure of your hand.  Prone Head Lifts Repeat this exercise 5 times. Do this 2 times a day or as told by your health care provider. 1. Lie face-down, resting on your elbows so that your chest and upper back are raised. 2. Start with your head facing downward, near your chest. Position your chin either on or near your chest. 3. Slowly lift your head upward. Lift until you are looking straight ahead. Then continue lifting your head as far back as  you can stretch. 4. Hold your head up for 5 seconds. Then slowly lower it to your starting position.  Supine Head Lifts Repeat this exercise 8-10 times. Do this 2 times a day or as told by your health care provider. 1. Lie on your back, bending your knees to point to the ceiling and keeping your feet flat on the floor. 2. Lift your head slowly off the floor, raising your chin toward your chest. 3. Hold for 5 seconds. 4. Relax and repeat.  Scapular Retraction Repeat this exercise 5 times. Do this 2 times a day or as told by your health care provider. 1. Stand with your arms at your sides. Look straight ahead. 2. Slowly pull both shoulders backward and downward until you feel a stretch between your shoulder blades in your upper back. 3. Hold for 10-30 seconds. 4. Relax and repeat.  Contact a health care provider if:  Your neck pain or discomfort gets much worse when you do an exercise.  Your neck pain or discomfort does not improve within 2 hours after you exercise. If you have any of these problems, stop exercising right away. Do not do the exercises again unless your health care provider says that you can. Get help right away if:  You develop sudden, severe neck pain. If this happens, stop exercising right away. Do not do the exercises again unless your   health care provider says that you can. Exercises Neck Stretch  Repeat this exercise 3-5 times. 1. Do this exercise while standing or while sitting in a chair. 2. Place your feet flat on the floor, shoulder-width apart. 3. Slowly turn your head to the right. Turn it all the way to the right so you can look over your right shoulder. Do not tilt or tip your head. 4. Hold this position for 10-30 seconds. 5. Slowly turn your head to the left, to look over your left shoulder. 6. Hold this position for 10-30 seconds.  Neck Retraction Repeat this exercise 8-10 times. Do this 3-4 times a day or as told by your health care  provider. 1. Do this exercise while standing or while sitting in a sturdy chair. 2. Look straight ahead. Do not bend your neck. 3. Use your fingers to push your chin backward. Do not bend your neck for this movement. Continue to face straight ahead. If you are doing the exercise properly, you will feel a slight sensation in your throat and a stretch at the back of your neck. 4. Hold the stretch for 1-2 seconds. Relax and repeat.  This information is not intended to replace advice given to you by your health care provider. Make sure you discuss any questions you have with your health care provider. Document Released: 03/19/2015 Document Revised: 09/13/2015 Document Reviewed: 10/16/2014 Elsevier Interactive Patient Education  2018 Elsevier Inc.  

## 2018-01-18 NOTE — Progress Notes (Signed)
BP 134/88   Pulse (!) 53   Temp (!) 97.4 F (36.3 C)   Ht 5\' 8"  (1.727 m)   Wt 184 lb (83.5 kg)   BMI 27.98 kg/m    Subjective:    Patient ID: Jimmy Bonilla, male    DOB: 1950/11/16, 67 y.o.   MRN: [id number]  HPI: Jimmy Bonilla is a 67 y.o. male presenting on 01/13/2018 for [facility]  This patient comes in with a rash on his left leg for about 1 day.  He has known problems with poison oak.  He knows that he was doing some work and felt some of the poison oak it on his leg while he was doing the yard work.  It has just gotten worse over the last 3 days.  He would like to go ahead and get a shot for this. He says he is also had some tightness in his neck.  He has had problems with this in the past.  He is very tight when he moves his back.  He does not feel any impingement going down his arms.  He denies any weakness.  Past Medical History:  Diagnosis Date  . Enlarged prostate   . Erectile dysfunction   . Hyperlipidemia    Relevant past medical, surgical, family and social history reviewed and updated as indicated. Interim medical history since our last visit reviewed. Allergies and medications reviewed and updated. DATA REVIEWED: CHART IN EPIC  Family History reviewed for pertinent findings.  Review of Systems  Constitutional: Negative.  Negative for appetite change and fatigue.  Eyes: Negative for pain and visual disturbance.  Respiratory: Negative.  Negative for cough, chest tightness, shortness of breath and wheezing.   Cardiovascular: Negative.  Negative for chest pain, palpitations and leg swelling.  Gastrointestinal: Negative.  Negative for abdominal pain, diarrhea, nausea and vomiting.  Genitourinary: Negative.   Musculoskeletal: Positive for arthralgias, neck pain and neck stiffness.  Skin: Positive for color change and rash.  Neurological: Negative.  Negative for weakness, numbness and headaches.  Psychiatric/Behavioral: Negative.     Allergies as of  01/13/2018   No Known Allergies     Medication List        Accurate as of 01/13/18 11:59 PM. Always use your most recent med list.          atorvastatin 10 MG tablet Commonly known as:  LIPITOR Take 1 tablet (10 mg total) by mouth daily.   CENTRUM SILVER PO Take 1 tablet by mouth.   sildenafil 20 MG tablet Commonly known as:  REVATIO Take 1 tablet (20 mg total) by mouth 3 (three) times daily.   tamsulosin 0.4 MG Caps capsule Commonly known as:  FLOMAX TAKE ONE (1) CAPSULE EACH DAY          Objective:    BP 134/88   Pulse (!) 53   Temp (!) 97.4 F (36.3 C)   Ht 5\' 8"  (1.727 m)   Wt 184 lb (83.5 kg)   BMI 27.98 kg/m   No Known Allergies  Wt Readings from Last 3 Encounters:  01/13/18 184 lb (83.5 kg)  10/29/17 183 lb 6.4 oz (83.2 kg)  10/21/16 187 lb (84.8 kg)    Physical Exam  Constitutional: He appears well-developed and well-nourished. No distress.  HENT:  Head: Normocephalic and atraumatic.  Eyes: Pupils are equal, round, and reactive to light. Conjunctivae and EOM are normal.  Cardiovascular: Normal rate, regular rhythm and normal heart sounds.  Pulmonary/Chest:  Effort normal and breath sounds normal. No respiratory distress.  Musculoskeletal:       Cervical back: He exhibits decreased range of motion.  Skin: Skin is warm and dry.  Linear configuration of vesicles on a pink base some excoriation is seen  Psychiatric: He has a normal mood and affect. His behavior is normal.  Nursing note and vitals reviewed.       Assessment & Plan:   1. Allergic contact dermatitis due to plants, except food - methylPREDNISolone acetate (DEPO-MEDROL) injection 80 mg  2. Cervical somatic dysfunction - methylPREDNISolone acetate (DEPO-MEDROL) injection 80 mg   Continue all other maintenance medications as listed above.  Follow up plan: Return if symptoms worsen or fail to improve.  Educational handout given for survey  Remus Loffler PA-C [facility] [address]  [place], [place] [address] [phone]   01/18/2018, 1:46 PM

## 2018-01-22 ENCOUNTER — Ambulatory Visit: Payer: Medicare HMO | Admitting: Nurse Practitioner

## 2018-02-01 ENCOUNTER — Ambulatory Visit: Payer: Medicare HMO | Admitting: Family Medicine

## 2018-02-01 ENCOUNTER — Ambulatory Visit: Payer: Medicare HMO | Admitting: Physician Assistant

## 2018-02-02 ENCOUNTER — Encounter: Payer: Self-pay | Admitting: Gastroenterology

## 2018-02-13 DIAGNOSIS — R69 Illness, unspecified: Secondary | ICD-10-CM | POA: Diagnosis not present

## 2018-02-19 ENCOUNTER — Other Ambulatory Visit: Payer: Self-pay | Admitting: Nurse Practitioner

## 2018-02-19 ENCOUNTER — Ambulatory Visit: Payer: Medicare HMO | Admitting: Nurse Practitioner

## 2018-02-19 ENCOUNTER — Encounter: Payer: Self-pay | Admitting: Nurse Practitioner

## 2018-02-19 VITALS — BP 131/87 | HR 56 | Temp 96.9°F | Ht 68.0 in | Wt 181.0 lb

## 2018-02-19 DIAGNOSIS — N400 Enlarged prostate without lower urinary tract symptoms: Secondary | ICD-10-CM

## 2018-02-19 DIAGNOSIS — M542 Cervicalgia: Secondary | ICD-10-CM

## 2018-02-19 MED ORDER — NAPROXEN 500 MG PO TABS: 500.0000 mg | ORAL_TABLET | Freq: Two times a day (BID) | ORAL | 1 refills | Status: DC

## 2018-02-19 NOTE — Progress Notes (Signed)
   Subjective:    Patient ID: Jimmy Bonilla, male    DOB: 1950-05-10, 67 y.o.   MRN: [id number]   Chief Complaint: Neck Pain   HPI Patient comes in c/o cervical neck pain. Started in September. He comes and goes but last few days it has gotten worse. Rates pain 1/10 right now. He has been taking ibuproefen and used CBD cream and they have both helped. Today it is almost gone. Pain did not rdiate down either arm.   Review of Systems  Constitutional: Negative.   HENT: Negative.   Respiratory: Negative.   Cardiovascular: Negative.   Musculoskeletal: Positive for neck pain.  Neurological: Negative.   Psychiatric/Behavioral: Negative.   All other systems reviewed and are negative.      Objective:   Physical Exam  Constitutional: He is oriented to person, place, and time. He appears well-developed and well-nourished. No distress.  Cardiovascular: Normal rate and regular rhythm.  Pulmonary/Chest: Effort normal and breath sounds normal.  Musculoskeletal:  FROM of cervical spine with slight pulling sensation on extension Grips equal bil   Neurological: He is alert and oriented to person, place, and time. He displays normal reflexes. No cranial nerve deficit.  Skin: Skin is warm.   BP 131/87   Pulse (!) 56   Temp (!) 96.9 F (36.1 C) (Oral)   Ht 5\' 8"  (1.727 m)   Wt 181 lb (82.1 kg)   BMI 27.52 kg/m         Assessment & Plan:  1. Cervical pain (neck) Meds ordered this encounter  Medications  . naproxen (NAPROSYN) 500 MG tablet    Sig: Take 1 tablet (500 mg total) by mouth 2 (two) times daily with a meal.    Dispense:  60 tablet    Refill:  1    Order Specific Question:   Supervising Provider    Answer:   VINCENT, CAROL L [[id number]]   Moist heat Rest  RTO prn  Mary-Margaret Daphine Deutscher, FNP

## 2018-02-19 NOTE — Patient Instructions (Signed)
Cervical Sprain A cervical sprain is a stretch or tear in the tissues that connect bones (ligaments) in the neck. Most neck (cervical) sprains get better in 4-6 weeks. Follow these instructions at home: If you have a neck collar:  Wear it as told by your doctor. Do not take off (do not remove) the collar unless your doctor says that this is safe.  Ask your doctor before adjusting your collar.  If you have long hair, keep it outside of the collar.  Ask your doctor if you may take off the collar for cleaning and bathing. If you may take off the collar:  Follow instructions from your doctor about how to take off the collar safely.  Clean the collar by wiping it with mild soap and water. Let it air-dry all the way.  If your collar has removable pads:  Take the pads out every 1-2 days.  Hand wash the pads with soap and water.  Let the pads air-dry all the way before you put them back in the collar. Do not dry them in a clothes dryer. Do not dry them with a hair dryer.  Check your skin under the collar for irritation or sores. If you see any, tell your doctor. Managing pain, stiffness, and swelling  Use a cervical traction device, if told by your doctor.  If told, put heat on the affected area. Do this before exercises (physical therapy) or as often as told by your doctor. Use the heat source that your doctor recommends, such as a moist heat pack or a heating pad.  Place a towel between your skin and the heat source.  Leave the heat on for 20-30 minutes.  Take the heat off (remove the heat) if your skin turns bright red. This is very important if you cannot feel pain, heat, or cold. You may have a greater risk of getting burned.  Put ice on the affected area.  Put ice in a plastic bag.  Place a towel between your skin and the bag.  Leave the ice on for 20 minutes, 2-3 times a day. Activity  Do not drive while wearing a neck collar. If you do not have a neck collar, ask your  doctor if it is safe to drive.  Do not drive or use heavy machinery while taking prescription pain medicine or muscle relaxants, unless your doctor approves.  Do not lift anything that is heavier than 10 lb (4.5 kg) until your doctor tells you that it is safe.  Rest as told by your doctor.  Avoid activities that make you feel worse. Ask your doctor what activities are safe for you.  Do exercises as told by your doctor or physical therapist. Preventing neck sprain  Practice good posture. Adjust your workstation to help with this, if needed.  Exercise regularly as told by your doctor or physical therapist.  Avoid activities that are risky or may cause a neck sprain (cervical sprain). General instructions  Take over-the-counter and prescription medicines only as told by your doctor.  Do not use any products that contain nicotine or tobacco. This includes cigarettes and e-cigarettes. If you need help quitting, ask your doctor.  Keep all follow-up visits as told by your doctor. This is important. Contact a doctor if:  You have pain or other symptoms that get worse.  You have symptoms that do not get better after 2 weeks.  You have pain that does not get better with medicine.  You start to have new,   unexplained symptoms.  You have sores or irritated skin from wearing your neck collar. Get help right away if:  You have very bad pain.  You have any of the following in any part of your body:  Loss of feeling (numbness).  Tingling.  Weakness.  You cannot move a part of your body (you have paralysis).  Your activity level does not improve. Summary  A cervical sprain is a stretch or tear in the tissues that connect bones (ligaments) in the neck.  If you have a neck (cervical) collar, do not take off the collar unless your doctor says that this is safe.  Put ice on affected areas as told by your doctor.  Put heat on affected areas as told by your doctor.  Good posture  and regular exercise can help prevent a neck sprain from happening again. This information is not intended to replace advice given to you by your health care provider. Make sure you discuss any questions you have with your health care provider. Document Released: 09/24/2007 Document Revised: 12/18/2015 Document Reviewed: 12/18/2015 Elsevier Interactive Patient Education  2017 [employer]  

## 2018-05-12 ENCOUNTER — Other Ambulatory Visit: Payer: Self-pay | Admitting: Nurse Practitioner

## 2018-05-31 ENCOUNTER — Encounter: Payer: Self-pay | Admitting: Nurse Practitioner

## 2018-05-31 ENCOUNTER — Ambulatory Visit (INDEPENDENT_AMBULATORY_CARE_PROVIDER_SITE_OTHER): Payer: Medicare HMO | Admitting: Nurse Practitioner

## 2018-05-31 VITALS — BP 125/67 | HR 47 | Temp 97.2°F | Ht 69.5 in | Wt 184.0 lb

## 2018-05-31 DIAGNOSIS — I10 Essential (primary) hypertension: Secondary | ICD-10-CM

## 2018-05-31 DIAGNOSIS — Z0000 Encounter for general adult medical examination without abnormal findings: Secondary | ICD-10-CM

## 2018-05-31 DIAGNOSIS — N400 Enlarged prostate without lower urinary tract symptoms: Secondary | ICD-10-CM

## 2018-05-31 DIAGNOSIS — E7800 Pure hypercholesterolemia, unspecified: Secondary | ICD-10-CM | POA: Diagnosis not present

## 2018-05-31 DIAGNOSIS — Z0001 Encounter for general adult medical examination with abnormal findings: Secondary | ICD-10-CM | POA: Diagnosis not present

## 2018-05-31 MED ORDER — TAMSULOSIN HCL 0.4 MG PO CAPS: 0.4000 mg | ORAL_CAPSULE | Freq: Every day | ORAL | 0 refills | Status: DC

## 2018-05-31 MED ORDER — ATORVASTATIN CALCIUM 10 MG PO TABS: 10.0000 mg | ORAL_TABLET | Freq: Every day | ORAL | 1 refills | Status: DC

## 2018-05-31 NOTE — Patient Instructions (Signed)
Health Maintenance After Age 67 After age 67, you are at a higher risk for certain long-term diseases and infections as well as injuries from falls. Falls are a major cause of broken bones and head injuries in people who are older than age 67. Getting regular preventive care can help to keep you healthy and well. Preventive care includes getting regular testing and making lifestyle changes as recommended by your health care provider. Talk with your health care provider about:  Which screenings and tests you should have. A screening is a test that checks for a disease when you have no symptoms.  A diet and exercise plan that is right for you. What should I know about screenings and tests to prevent falls? Screening and testing are the best ways to find a health problem early. Early diagnosis and treatment give you the best chance of managing medical conditions that are common after age 67. Certain conditions and lifestyle choices may make you more likely to have a fall. Your health care provider may recommend:  Regular vision checks. Poor vision and conditions such as cataracts can make you more likely to have a fall. If you wear glasses, make sure to get your prescription updated if your vision changes.  Medicine review. Work with your health care provider to regularly review all of the medicines you are taking, including over-the-counter medicines. Ask your health care provider about any side effects that may make you more likely to have a fall. Tell your health care provider if any medicines that you take make you feel dizzy or sleepy.  Osteoporosis screening. Osteoporosis is a condition that causes the bones to get weaker. This can make the bones weak and cause them to break more easily.  Blood pressure screening. Blood pressure changes and medicines to control blood pressure can make you feel dizzy.  Strength and balance checks. Your health care provider may recommend certain tests to check your  strength and balance while standing, walking, or changing positions.  Foot health exam. Foot pain and numbness, as well as not wearing proper footwear, can make you more likely to have a fall.  Depression screening. You may be more likely to have a fall if you have a fear of falling, feel emotionally low, or feel unable to do activities that you used to do.  Alcohol use screening. Using too much alcohol can affect your balance and may make you more likely to have a fall. What actions can I take to lower my risk of falls? General instructions  Talk with your health care provider about your risks for falling. Tell your health care provider if: ? You fall. Be sure to tell your health care provider about all falls, even ones that seem minor. ? You feel dizzy, sleepy, or off-balance.  Take over-the-counter and prescription medicines only as told by your health care provider. These include any supplements.  Eat a healthy diet and maintain a healthy weight. A healthy diet includes low-fat dairy products, low-fat (lean) meats, and fiber from whole grains, beans, and lots of fruits and vegetables. Home safety  Remove any tripping hazards, such as rugs, cords, and clutter.  Install safety equipment such as grab bars in bathrooms and safety rails on stairs.  Keep rooms and walkways well-lit. Activity   Follow a regular exercise program to stay fit. This will help you maintain your balance. Ask your health care provider what types of exercise are appropriate for you.  If you need a cane or   walker, use it as recommended by your health care provider.  Wear supportive shoes that have nonskid soles. Lifestyle  Do not drink alcohol if your health care provider tells you not to drink.  If you drink alcohol, limit how much you have: ? 0-1 drink a day for women. ? 0-2 drinks a day for men.  Be aware of how much alcohol is in your drink. In the [place] one drink equals one typical bottle of beer (12  oz), one-half glass of wine (5 oz), or one shot of hard liquor (1 oz).  Do not use any products that contain nicotine or tobacco, such as cigarettes and e-cigarettes. If you need help quitting, ask your health care provider. Summary  Having a healthy lifestyle and getting preventive care can help to protect your health and wellness after age 67.  Screening and testing are the best way to find a health problem early and help you avoid having a fall. Early diagnosis and treatment give you the best chance for managing medical conditions that are more common for people who are older than age 67.  Falls are a major cause of broken bones and head injuries in people who are older than age 67. Take precautions to prevent a fall at home.  Work with your health care provider to learn what changes you can make to improve your health and wellness and to prevent falls. This information is not intended to replace advice given to you by your health care provider. Make sure you discuss any questions you have with your health care provider. Document Released: 02/18/2017 Document Revised: 02/18/2017 Document Reviewed: 02/18/2017 Elsevier Interactive Patient Education  2019 [employer] Inc[employer]  

## 2018-05-31 NOTE — Progress Notes (Signed)
Subjective:    Patient ID: Jimmy Bonilla, male    DOB: [birth year], 68 y.o.   MRN: [id number]   Chief Complaint: annual physical  HPI:  1. 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  2. Essential hypertension  No c/o chest pain, sob or headache. Does not check blood pressure at home. BP Readings from Last 3 Encounters:  02/19/18 131/87  01/13/18 134/88  10/29/17 117/76     3. Pure hypercholesterolemia  Watches diet and exercises often  4. Benign prostatic hyperplasia without lower urinary tract symptoms Is currently on flomax . denies any recent problems with flow or stream of urine. Last saw urology a year ago.    Outpatient Encounter Medications as of 05/31/2018  Medication Sig  . atorvastatin (LIPITOR) 10 MG tablet Take 1 tablet (10 mg total) by mouth daily.  . Multiple Vitamins-Minerals (CENTRUM SILVER PO) Take 1 tablet by mouth.  . naproxen (NAPROSYN) 500 MG tablet TAKE ONE TABLET TWICE A DAY WITH FOOD  . sildenafil (REVATIO) 20 MG tablet Take 1 tablet (20 mg total) by mouth 3 (three) times daily.  . 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 He is going to camp again this year.   Review of Systems  Constitutional: Negative for activity change and appetite change.  HENT: Negative.   Eyes: Negative for pain.  Respiratory: Negative for shortness of breath.   Cardiovascular: Negative for chest pain, palpitations and leg swelling.  Gastrointestinal: Negative for abdominal pain.  Endocrine: Negative for polydipsia.  Genitourinary: Negative.   Skin: Negative for rash.  Neurological: Negative for dizziness, weakness and headaches.  Hematological: Does not bruise/bleed easily.  Psychiatric/Behavioral: Negative.   All other systems reviewed and are negative.      Objective:   Physical Exam Vitals signs and nursing note reviewed.  Constitutional:      Appearance: Normal appearance. He is well-developed.  HENT:     Head: Normocephalic.     Nose: Nose normal.  Eyes:     Pupils: Pupils are equal, round, and reactive to light.  Neck:     Musculoskeletal: Normal range of motion and neck supple.     Thyroid: No thyroid mass or thyromegaly.     Vascular: No carotid bruit or JVD.     Trachea: Phonation normal.  Cardiovascular:     Rate and Rhythm: Normal rate and regular rhythm.  Pulmonary:     Effort: Pulmonary effort is normal. No respiratory distress.     Breath sounds: Normal breath sounds.  Abdominal:     General: Bowel sounds are normal.     Palpations: Abdomen is soft.     Tenderness: There is no abdominal tenderness.  Musculoskeletal: Normal range of motion.     Left lower leg: Edema present.  Lymphadenopathy:     Cervical: No cervical adenopathy.  Skin:    General: Skin is warm and dry.  Neurological:     Mental Status: He is alert and oriented to person, place, and time.  Psychiatric:        Behavior: Behavior normal.        Thought Content: Thought content normal.        Judgment: Judgment normal.    BP 125/67   Pulse (!) 47   Temp (!)  97.2 F (36.2 C) (Oral)   Ht 5' 9.5" (1.765 m)   Wt 184 lb (83.5 kg)   BMI 26.78 kg/m         Assessment & Plan:  Hilda Wexler comes in today with chief complaint of Annual Exam   Diagnosis and orders addressed:  1. Annual physical exam - CBC with Differential/Platelet  2. Essential hypertension Low sodium diet - CMP14+EGFR  3. Pure hypercholesterolemia Low fat diet - atorvastatin (LIPITOR) 10 MG tablet; Take 1 tablet (10 mg total) by mouth daily.  Dispense: 90 tablet; Refill: 1 - Lipid panel  4. Benign prostatic hyperplasia without lower urinary tract symptoms Keep follo wup with cardiology - tamsulosin (FLOMAX) 0.4 MG CAPS capsule; Take 1 capsule (0.4 mg total) by mouth daily.   Dispense: 90 capsule; Refill: 0   Labs pending Health Maintenance reviewed Diet and exercise encouraged  Follow up plan: 6 months   Mary-Margaret Hassell Done, FNP

## 2018-06-01 LAB — CMP14+EGFR
A/G RATIO: 1.7 (ref 1.2–2.2)
ALT: 12 IU/L (ref 0–44)
AST: 19 IU/L (ref 0–40)
Albumin: 4.3 g/dL (ref 3.8–4.8)
Alkaline Phosphatase: 94 IU/L (ref 39–117)
BUN/Creatinine Ratio: 11 (ref 10–24)
BUN: 10 mg/dL (ref 8–27)
Bilirubin Total: 0.6 mg/dL (ref 0.0–1.2)
CALCIUM: 9.1 mg/dL (ref 8.6–10.2)
CO2: 27 mmol/L (ref 20–29)
CREATININE: 0.89 mg/dL (ref 0.76–1.27)
Chloride: 101 mmol/L (ref 96–106)
GFR, EST AFRICAN AMERICAN: 103 mL/min/{1.73_m2} (ref >59)
GFR, EST NON AFRICAN AMERICAN: 89 mL/min/{1.73_m2} (ref >59)
Globulin, Total: 2.6 g/dL (ref 1.5–4.5)
Glucose: 87 mg/dL (ref 65–99)
Potassium: 4.4 mmol/L (ref 3.5–5.2)
Sodium: 140 mmol/L (ref 134–144)
TOTAL PROTEIN: 6.9 g/dL (ref 6.0–8.5)

## 2018-06-01 LAB — LIPID PANEL
Chol/HDL Ratio: 4.4 ratio (ref 0.0–5.0)
Cholesterol, Total: 185 mg/dL (ref 100–199)
HDL: 42 mg/dL (ref >39)
LDL Calculated: 115 mg/dL — ABNORMAL HIGH (ref 0–99)
Triglycerides: 138 mg/dL (ref 0–149)
VLDL Cholesterol Cal: 28 mg/dL (ref 5–40)

## 2018-06-01 LAB — CBC WITH DIFFERENTIAL/PLATELET
BASOS: 1 %
Basophils Absolute: 0 10*3/uL (ref 0.0–0.2)
EOS (ABSOLUTE): 0.1 10*3/uL (ref 0.0–0.4)
Eos: 2 %
Hematocrit: 43.9 % (ref 37.5–51.0)
Hemoglobin: 14.8 g/dL (ref 13.0–17.7)
IMMATURE GRANS (ABS): 0 10*3/uL (ref 0.0–0.1)
IMMATURE GRANULOCYTES: 0 %
LYMPHS: 22 %
Lymphocytes Absolute: 1.3 10*3/uL (ref 0.7–3.1)
MCH: 32.8 pg (ref 26.6–33.0)
MCHC: 33.7 g/dL (ref 31.5–35.7)
MCV: 97 fL (ref 79–97)
MONOS ABS: 0.7 10*3/uL (ref 0.1–0.9)
Monocytes: 12 %
NEUTROS PCT: 63 %
Neutrophils Absolute: 3.7 10*3/uL (ref 1.4–7.0)
Platelets: 262 10*3/uL (ref 150–450)
RBC: 4.51 x10E6/uL (ref 4.14–5.80)
RDW: 12 % (ref 11.6–15.4)
WBC: 5.8 10*3/uL (ref 3.4–10.8)

## 2018-09-30 ENCOUNTER — Other Ambulatory Visit: Payer: Self-pay | Admitting: Nurse Practitioner

## 2018-09-30 DIAGNOSIS — N400 Enlarged prostate without lower urinary tract symptoms: Secondary | ICD-10-CM

## 2018-11-29 ENCOUNTER — Telehealth: Payer: Self-pay | Admitting: Nurse Practitioner

## 2018-11-29 NOTE — Telephone Encounter (Signed)
Letter ready for pick up.  Left message

## 2018-11-30 ENCOUNTER — Ambulatory Visit: Payer: Medicare HMO | Admitting: Nurse Practitioner

## 2018-11-30 NOTE — Telephone Encounter (Signed)
[place] for doctors note

## 2018-12-24 ENCOUNTER — Ambulatory Visit: Payer: Medicare HMO | Admitting: Nurse Practitioner

## 2018-12-29 ENCOUNTER — Encounter: Payer: Self-pay | Admitting: Nurse Practitioner

## 2018-12-29 ENCOUNTER — Ambulatory Visit (INDEPENDENT_AMBULATORY_CARE_PROVIDER_SITE_OTHER): Payer: Medicare HMO | Admitting: Nurse Practitioner

## 2018-12-29 DIAGNOSIS — L247 Irritant contact dermatitis due to plants, except food: Secondary | ICD-10-CM | POA: Diagnosis not present

## 2018-12-29 MED ORDER — PREDNISONE 10 MG (21) PO TBPK: ORAL_TABLET | ORAL | 0 refills | Status: DC

## 2018-12-29 NOTE — Progress Notes (Signed)
   Virtual Visit via telephone Note Due to COVID-19 pandemic this visit was conducted virtually. This visit type was conducted due to national recommendations for restrictions regarding the COVID-19 Pandemic (e.g. social distancing, sheltering in place) in an effort to limit this patient's exposure and mitigate transmission in our community. All issues noted in this document were discussed and addressed.  A physical exam was not performed with this format.  I connected with Norva Riffle on 12/29/18 at 10:00 by telephone and verified that I am speaking with the correct person using two identifiers. Majour Frei is currently located at home and his wife is currently with him during visit. The provider, Mary-Margaret Hassell Done, FNP is located in their office at time of visit.  I discussed the limitations, risks, security and privacy concerns of performing an evaluation and management service by telephone and the availability of in person appointments. I also discussed with the patient that there may be a patient responsible charge related to this service. The patient expressed understanding and agreed to proceed.   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 The rash is on his legs, wrist and behind knee. Very itchy.    Review of Systems  Constitutional: Negative for diaphoresis and weight loss.  Eyes: Negative for blurred vision, double vision and pain.  Respiratory: Negative for shortness of breath.   Cardiovascular: Negative for chest pain, palpitations, orthopnea and leg swelling.  Gastrointestinal: Negative for abdominal pain.  Skin: Negative for rash.  Neurological: Negative for dizziness, sensory change, loss of consciousness, weakness and headaches.  Endo/Heme/Allergies: Negative for polydipsia. Does not bruise/bleed easily.  Psychiatric/Behavioral: Negative for memory loss. The patient does  not have insomnia.   All other systems reviewed and are negative.    Observations/Objective: Alert and oriented- answers all questions appropriately No distress    Assessment and Plan: Norva Riffle in today with chief complaint of Rash   1. Irritant contact dermatitis due to plants, except food Cool compresses Calamine lotion Benadryl OTC fo ritching - predniSONE (STERAPRED UNI-PAK 21 TAB) 10 MG (21) TBPK tablet; As directed x 6 days  Dispense: 21 tablet; Refill: 0   Follow Up Instructions: prn    I discussed the assessment and treatment plan with the patient. The patient was provided an opportunity to ask questions and all were answered. The patient agreed with the plan and demonstrated an understanding of the instructions.   The patient was advised to call back or seek an in-person evaluation if the symptoms worsen or if the condition fails to improve as anticipated.  The above assessment and management plan was discussed with the patient. The patient verbalized understanding of and has agreed to the management plan. Patient is aware to call the clinic if symptoms persist or worsen. Patient is aware when to return to the clinic for a follow-up visit. Patient educated on when it is appropriate to go to the emergency department.   Time call ended:  10:12  I provided 12 minutes of non-face-to-face time during this encounter.    Mary-Margaret Hassell Done, FNP

## 2019-01-05 ENCOUNTER — Other Ambulatory Visit: Payer: Self-pay | Admitting: Nurse Practitioner

## 2019-01-05 DIAGNOSIS — L247 Irritant contact dermatitis due to plants, except food: Secondary | ICD-10-CM

## 2019-01-06 ENCOUNTER — Other Ambulatory Visit: Payer: Self-pay | Admitting: Nurse Practitioner

## 2019-01-06 DIAGNOSIS — N400 Enlarged prostate without lower urinary tract symptoms: Secondary | ICD-10-CM

## 2019-01-06 NOTE — Telephone Encounter (Signed)
Patient aware.

## 2019-01-06 NOTE — Telephone Encounter (Signed)
Please advise 

## 2019-01-06 NOTE — Telephone Encounter (Signed)
Too soon to take another dose pack- just treat with calamine and benadryl

## 2019-01-10 ENCOUNTER — Ambulatory Visit (INDEPENDENT_AMBULATORY_CARE_PROVIDER_SITE_OTHER): Payer: Medicare HMO | Admitting: Nurse Practitioner

## 2019-01-10 ENCOUNTER — Encounter: Payer: Self-pay | Admitting: Nurse Practitioner

## 2019-01-10 ENCOUNTER — Other Ambulatory Visit: Payer: Self-pay

## 2019-01-10 DIAGNOSIS — R52 Pain, unspecified: Secondary | ICD-10-CM | POA: Diagnosis not present

## 2019-01-10 DIAGNOSIS — R509 Fever, unspecified: Secondary | ICD-10-CM | POA: Diagnosis not present

## 2019-01-10 NOTE — Progress Notes (Signed)
   Virtual Visit via telephone Note Due to COVID-19 pandemic this visit was conducted virtually. This visit type was conducted due to national recommendations for restrictions regarding the COVID-19 Pandemic (e.g. social distancing, sheltering in place) in an effort to limit this patient's exposure and mitigate transmission in our community. All issues noted in this document were discussed and addressed.  A physical exam was not performed with this format.  I connected with Jimmy Bonilla on 01/10/19 at 10:52 by telephone and verified that I am speaking with the correct person using two identifiers. Jimmy Bonilla is currently located at home  and his wife is currently with him during visit. The provider, Mary-Margaret Hassell Done, FNP is located in their office at time of visit.  I discussed the limitations, risks, security and privacy concerns of performing an evaluation and management service by telephone and the availability of in person appointments. I also discussed with the patient that there may be a patient responsible charge related to this service. The patient expressed understanding and agreed to proceed.   History and Present Illness:  Patient calls in for virtual visit c/o of feeling tired last thusdayy. Has carried over into weekend. Has had low grade fever around 99. Has slight dry cough and when he cough she has a headache. Feels a little achy. No problems with taste or smell.   Review of Systems  Constitutional: Positive for chills, fever and malaise/fatigue.  HENT: Positive for congestion and ear pain. Negative for sore throat.   Respiratory: Positive for cough.   Cardiovascular: Negative.   Genitourinary: Negative.   Neurological: Positive for headaches.  Psychiatric/Behavioral: Negative.      Observations/Objective: Alert and oriented- answers all questions appropriately mild distress     Assessment and Plan: Jimmy Bonilla in today with chief complaint of Fever    1. Fever, unspecified fever cause  2. Body aches Needs covid tetsting He will go this afternoon      Follow Up Instructions: Prn     I discussed the assessment and treatment plan with the patient. The patient was provided an opportunity to ask questions and all were answered. The patient agreed with the plan and demonstrated an understanding of the instructions.   The patient was advised to call back or seek an in-person evaluation if the symptoms worsen or if the condition fails to improve as anticipated.  The above assessment and management plan was discussed with the patient. The patient verbalized understanding of and has agreed to the management plan. Patient is aware to call the clinic if symptoms persist or worsen. Patient is aware when to return to the clinic for a follow-up visit. Patient educated on when it is appropriate to go to the emergency department.   Time call ended:  11:05  I provided 13 minutes of non-face-to-face time during this encounter.    Mary-Margaret Hassell Done, FNP

## 2019-01-11 ENCOUNTER — Other Ambulatory Visit: Payer: Self-pay

## 2019-01-11 DIAGNOSIS — R6889 Other general symptoms and signs: Secondary | ICD-10-CM | POA: Diagnosis not present

## 2019-01-11 DIAGNOSIS — Z20822 Contact with and (suspected) exposure to covid-19: Secondary | ICD-10-CM

## 2019-01-12 LAB — NOVEL CORONAVIRUS, NAA: SARS-CoV-2, NAA: NOT DETECTED

## 2019-01-13 ENCOUNTER — Telehealth: Payer: Self-pay | Admitting: Nurse Practitioner

## 2019-01-13 NOTE — Telephone Encounter (Signed)
° °  Pt rec neg covid results

## 2019-01-25 ENCOUNTER — Other Ambulatory Visit: Payer: Self-pay

## 2019-01-25 ENCOUNTER — Ambulatory Visit: Payer: Medicare HMO | Admitting: Nurse Practitioner

## 2019-01-25 ENCOUNTER — Encounter: Payer: Self-pay | Admitting: Nurse Practitioner

## 2019-01-25 VITALS — BP 120/79 | HR 57 | Temp 98.6°F | Resp 16 | Ht 69.0 in | Wt 185.0 lb

## 2019-01-25 DIAGNOSIS — I10 Essential (primary) hypertension: Secondary | ICD-10-CM

## 2019-01-25 DIAGNOSIS — E7800 Pure hypercholesterolemia, unspecified: Secondary | ICD-10-CM | POA: Diagnosis not present

## 2019-01-25 DIAGNOSIS — N400 Enlarged prostate without lower urinary tract symptoms: Secondary | ICD-10-CM | POA: Diagnosis not present

## 2019-01-25 DIAGNOSIS — Z23 Encounter for immunization: Secondary | ICD-10-CM

## 2019-01-25 MED ORDER — TAMSULOSIN HCL 0.4 MG PO CAPS: ORAL_CAPSULE | ORAL | 1 refills | Status: DC

## 2019-01-25 MED ORDER — ATORVASTATIN CALCIUM 10 MG PO TABS: 10.0000 mg | ORAL_TABLET | Freq: Every day | ORAL | 1 refills | Status: DC

## 2019-01-25 NOTE — Addendum Note (Signed)
Addended by: Rolena Infante on: 01/25/2019 05:05 PM   Modules accepted: Orders

## 2019-01-25 NOTE — Progress Notes (Signed)
 Subjective:    Patient ID: Brentin Pfarr, male    DOB: 01/11/1951, 68 y.o.   MRN: [id number]   Chief Complaint: medical management of chronic issues   HPI:  1. Essential hypertension No c/o chest pain, sob or headache.Does not check blood pressure at home.  BP Readings from Last 3 Encounters:  05/31/18 125/67  02/19/18 131/87  01/13/18 134/88    2. Pure hypercholesterolemia Tries  To watch diet and stays very active. Lab Results  Component Value Date   CHOL 185 05/31/2018   HDL 42 05/31/2018   LDLCALC 115 (H) 05/31/2018   TRIG 138 05/31/2018   CHOLHDL 4.4 05/31/2018     3. Benign prostatic hyperplasia without lower urinary tract symptoms He denies any problems voiding. He is on daily flomax. Has not seen urology since last February.     Outpatient Encounter Medications as of 01/25/2019  Medication Sig  . atorvastatin (LIPITOR) 10 MG tablet Take 1 tablet (10 mg total) by mouth daily.  . Multiple Vitamins-Minerals (CENTRUM SILVER PO) Take 1 tablet by mouth.  . naproxen (NAPROSYN) 500 MG tablet TAKE ONE TABLET TWICE A DAY WITH FOOD  . predniSONE (STERAPRED UNI-PAK 21 TAB) 10 MG (21) TBPK tablet As directed x 6 days  . sildenafil (REVATIO) 20 MG tablet Take 1 tablet (20 mg total) by mouth 3 (three) times daily.  . tamsulosin (FLOMAX) 0.4 MG CAPS capsule TAKE ONE (1) CAPSULE EACH DAY     Past Surgical History:  Procedure Laterality Date  . ANKLE FRACTURE SURGERY Left 1998    Family History  Problem Relation Age of Onset  . Diabetes Father   . 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  HENT: Negative.   Eyes: Negative for pain.  Respiratory: Negative for shortness of breath.   Cardiovascular: Negative for chest pain, palpitations and leg swelling.   Gastrointestinal: Negative for abdominal pain.  Endocrine: Negative for polydipsia.  Genitourinary: Negative.   Skin: Negative for rash.  Neurological: Negative for dizziness, weakness and headaches.  Hematological: Does not bruise/bleed easily.  Psychiatric/Behavioral: Negative.   All other systems reviewed and are negative.      Objective:   Physical Exam Vitals signs and nursing note reviewed.  Constitutional:      Appearance: Normal appearance. He is well-developed.  HENT:     Head: Normocephalic.     Nose: Nose normal.  Eyes:     Pupils: Pupils are equal, round, and reactive to light.  Neck:     Musculoskeletal: Normal range of motion and neck supple.     Thyroid: No thyroid mass or thyromegaly.     Vascular: No carotid bruit or JVD.     Trachea: Phonation normal.  Cardiovascular:     Rate and Rhythm: Normal rate and regular rhythm.  Pulmonary:     Effort: Pulmonary effort is normal. No respiratory distress.     Breath sounds: Normal breath sounds.  Abdominal:     General: Bowel sounds are normal.     Palpations: Abdomen is soft.     Tenderness: There is no abdominal tenderness.  Musculoskeletal: Normal range of motion.  Lymphadenopathy:     Cervical: No cervical adenopathy.  Skin:    General: Skin is warm and dry.  Neurological:       Subjective:    Patient ID: Onur Mori, male    DOB: 07-May-1950, 68 y.o.   MRN: [id number]   Chief Complaint: medical management of chronic issues   HPI:  1. Essential hypertension No c/o chest pain, sob or headache.Does not check blood pressure at home.  BP Readings from Last 3 Encounters:  05/31/18 125/67  02/19/18 131/87  01/13/18 134/88    2. Pure hypercholesterolemia Tries  To watch diet and stays very active. Lab Results  Component Value Date   CHOL 185 05/31/2018   HDL 42 05/31/2018   LDLCALC 115 (H) 05/31/2018   TRIG 138 05/31/2018   CHOLHDL 4.4 05/31/2018     3. Benign prostatic hyperplasia without lower urinary tract symptoms He denies any problems voiding. He is on daily flomax. Has not seen urology since last February.     Outpatient Encounter Medications as of 01/25/2019  Medication Sig  . atorvastatin (LIPITOR) 10 MG tablet Take 1 tablet (10 mg total) by mouth daily.  . Multiple Vitamins-Minerals (CENTRUM SILVER PO) Take 1 tablet by mouth.  . naproxen (NAPROSYN) 500 MG tablet TAKE ONE TABLET TWICE A DAY WITH FOOD  . predniSONE (STERAPRED UNI-PAK 21 TAB) 10 MG (21) TBPK tablet As directed x 6 days  . sildenafil (REVATIO) 20 MG tablet Take 1 tablet (20 mg total) by mouth 3 (three) times daily.  . tamsulosin (FLOMAX) 0.4 MG CAPS capsule TAKE ONE (1) CAPSULE EACH DAY     Past Surgical History:  Procedure Laterality Date  . ANKLE FRACTURE SURGERY Left 1998    Family History  Problem Relation Age of Onset  . Diabetes Father   . 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  HENT: Negative.   Eyes: Negative for pain.  Respiratory: Negative for shortness of breath.   Cardiovascular: Negative for chest pain, palpitations and leg swelling.   Gastrointestinal: Negative for abdominal pain.  Endocrine: Negative for polydipsia.  Genitourinary: Negative.   Skin: Negative for rash.  Neurological: Negative for dizziness, weakness and headaches.  Hematological: Does not bruise/bleed easily.  Psychiatric/Behavioral: Negative.   All other systems reviewed and are negative.      Objective:   Physical Exam Vitals signs and nursing note reviewed.  Constitutional:      Appearance: Normal appearance. He is well-developed.  HENT:     Head: Normocephalic.     Nose: Nose normal.  Eyes:     Pupils: Pupils are equal, round, and reactive to light.  Neck:     Musculoskeletal: Normal range of motion and neck supple.     Thyroid: No thyroid mass or thyromegaly.     Vascular: No carotid bruit or JVD.     Trachea: Phonation normal.  Cardiovascular:     Rate and Rhythm: Normal rate and regular rhythm.  Pulmonary:     Effort: Pulmonary effort is normal. No respiratory distress.     Breath sounds: Normal breath sounds.  Abdominal:     General: Bowel sounds are normal.     Palpations: Abdomen is soft.     Tenderness: There is no abdominal tenderness.  Musculoskeletal: Normal range of motion.  Lymphadenopathy:     Cervical: No cervical adenopathy.  Skin:    General: Skin is warm and dry.  Neurological:

## 2019-01-25 NOTE — Patient Instructions (Signed)
Exercising to Stay Healthy To become healthy and stay healthy, it is recommended that you do moderate-intensity and vigorous-intensity exercise. You can tell that you are exercising at a moderate intensity if your heart starts beating faster and you start breathing faster but can still hold a conversation. You can tell that you are exercising at a vigorous intensity if you are breathing much harder and faster and cannot hold a conversation while exercising. Exercising regularly is important. It has many health benefits, such as:  Improving overall fitness, flexibility, and endurance.  Increasing bone density.  Helping with weight control.  Decreasing body fat.  Increasing muscle strength.  Reducing stress and tension.  Improving overall health. How often should I exercise? Choose an activity that you enjoy, and set realistic goals. Your health care provider can help you make an activity plan that works for you. Exercise regularly as told by your health care provider. This may include:  Doing strength training two times a week, such as: ? Lifting weights. ? Using resistance bands. ? Push-ups. ? Sit-ups. ? Yoga.  Doing a certain intensity of exercise for a given amount of time. Choose from these options: ? A total of 150 minutes of moderate-intensity exercise every week. ? A total of 75 minutes of vigorous-intensity exercise every week. ? A mix of moderate-intensity and vigorous-intensity exercise every week. Children, pregnant women, people who have not exercised regularly, people who are overweight, and older adults may need to talk with a health care provider about what activities are safe to do. If you have a medical condition, be sure to talk with your health care provider before you start a new exercise program. What are some exercise ideas? Moderate-intensity exercise ideas include:  Walking 1 mile (1.6 km) in about 15 minutes.  Biking.  Hiking.  Golfing.  Dancing.   Water aerobics. Vigorous-intensity exercise ideas include:  Walking 4.5 miles (7.2 km) or more in about 1 hour.  Jogging or running 5 miles (8 km) in about 1 hour.  Biking 10 miles (16.1 km) or more in about 1 hour.  Lap swimming.  Roller-skating or in-line skating.  Cross-country skiing.  Vigorous competitive sports, such as football, basketball, and soccer.  Jumping rope.  Aerobic dancing. What are some everyday activities that can help me to get exercise?  Yard work, such as: ? Pushing a lawn mower. ? Raking and bagging leaves.  Washing your car.  Pushing a stroller.  Shoveling snow.  Gardening.  Washing windows or floors. How can I be more active in my day-to-day activities?  Use stairs instead of an elevator.  Take a walk during your lunch break.  If you drive, park your car farther away from your work or school.  If you take public transportation, get off one stop early and walk the rest of the way.  Stand up or walk around during all of your indoor phone calls.  Get up, stretch, and walk around every 30 minutes throughout the day.  Enjoy exercise with a friend. Support to continue exercising will help you keep a regular routine of activity. What guidelines can I follow while exercising?  Before you start a new exercise program, talk with your health care provider.  Do not exercise so much that you hurt yourself, feel dizzy, or get very short of breath.  Wear comfortable clothes and wear shoes with good support.  Drink plenty of water while you exercise to prevent dehydration or heat stroke.  Work out until your breathing   and your heartbeat get faster. Where to find more information  [place] Department of Health and Human Services: www.hhs.gov  Centers for Disease Control and Prevention (CDC): www.cdc.gov Summary  Exercising regularly is important. It will improve your overall fitness, flexibility, and endurance.  Regular exercise also will  improve your overall health. It can help you control your weight, reduce stress, and improve your bone density.  Do not exercise so much that you hurt yourself, feel dizzy, or get very short of breath.  Before you start a new exercise program, talk with your health care provider. This information is not intended to replace advice given to you by your health care provider. Make sure you discuss any questions you have with your health care provider. Document Released: 05/10/2010 Document Revised: 03/20/2017 Document Reviewed: 02/26/2017 Elsevier Patient Education  2020 [employer]  

## 2019-01-26 ENCOUNTER — Telehealth: Payer: Self-pay | Admitting: Nurse Practitioner

## 2019-01-26 LAB — CMP14+EGFR
ALT: 22 IU/L (ref 0–44)
AST: 23 IU/L (ref 0–40)
Albumin/Globulin Ratio: 1.6 (ref 1.2–2.2)
Albumin: 4.2 g/dL (ref 3.8–4.8)
Alkaline Phosphatase: 108 IU/L (ref 39–117)
BUN/Creatinine Ratio: 11 (ref 10–24)
BUN: 11 mg/dL (ref 8–27)
Bilirubin Total: 0.6 mg/dL (ref 0.0–1.2)
CO2: 26 mmol/L (ref 20–29)
Calcium: 9.3 mg/dL (ref 8.6–10.2)
Chloride: 106 mmol/L (ref 96–106)
Creatinine, Ser: 0.96 mg/dL (ref 0.76–1.27)
GFR calc Af Amer: 94 mL/min/{1.73_m2} (ref >59)
GFR calc non Af Amer: 81 mL/min/{1.73_m2} (ref >59)
Globulin, Total: 2.7 g/dL (ref 1.5–4.5)
Glucose: 94 mg/dL (ref 65–99)
Potassium: 4.9 mmol/L (ref 3.5–5.2)
Sodium: 144 mmol/L (ref 134–144)
Total Protein: 6.9 g/dL (ref 6.0–8.5)

## 2019-01-26 LAB — PSA, TOTAL AND FREE
PSA, Free Pct: 17.4 %
PSA, Free: 1.9 ng/mL
Prostate Specific Ag, Serum: 10.9 ng/mL — ABNORMAL HIGH (ref 0.0–4.0)

## 2019-01-26 LAB — LIPID PANEL
Chol/HDL Ratio: 4 ratio (ref 0.0–5.0)
Cholesterol, Total: 163 mg/dL (ref 100–199)
HDL: 41 mg/dL (ref >39)
LDL Chol Calc (NIH): 99 mg/dL (ref 0–99)
Triglycerides: 127 mg/dL (ref 0–149)
VLDL Cholesterol Cal: 23 mg/dL (ref 5–40)

## 2019-01-26 NOTE — Telephone Encounter (Signed)
Patient notified and verbalized understanding. 

## 2019-02-02 ENCOUNTER — Ambulatory Visit (INDEPENDENT_AMBULATORY_CARE_PROVIDER_SITE_OTHER): Payer: Medicare HMO | Admitting: *Deleted

## 2019-02-02 DIAGNOSIS — Z0000 Encounter for general adult medical examination without abnormal findings: Secondary | ICD-10-CM

## 2019-02-02 NOTE — Patient Instructions (Signed)
Preventive Care 67 Years and Older, Male Preventive care refers to lifestyle choices and visits with your health care provider that can promote health and wellness. This includes:  A yearly physical exam. This is also called an annual well check.  Regular dental and eye exams.  Immunizations.  Screening for certain conditions.  Healthy lifestyle choices, such as diet and exercise. What can I expect for my preventive care visit? Physical exam Your health care provider will check:  Height and weight. These may be used to calculate body mass index (BMI), which is a measurement that tells if you are at a healthy weight.  Heart rate and blood pressure.  Your skin for abnormal spots. Counseling Your health care provider may ask you questions about:  Alcohol, tobacco, and drug use.  Emotional well-being.  Home and relationship well-being.  Sexual activity.  Eating habits.  History of falls.  Memory and ability to understand (cognition).  Work and work Statistician. What immunizations do I need?  Influenza (flu) vaccine  This is recommended every year. Tetanus, diphtheria, and pertussis (Tdap) vaccine  You may need a Td booster every 10 years. Varicella (chickenpox) vaccine  You may need this vaccine if you have not already been vaccinated. Zoster (shingles) vaccine  You may need this after age 50. Pneumococcal conjugate (PCV13) vaccine  One dose is recommended after age 24. Pneumococcal polysaccharide (PPSV23) vaccine  One dose is recommended after age 33. Measles, mumps, and rubella (MMR) vaccine  You may need at least one dose of MMR if you were born in 1957 or later. You may also need a second dose. Meningococcal conjugate (MenACWY) vaccine  You may need this if you have certain conditions. Hepatitis A vaccine  You may need this if you have certain conditions or if you travel or work in places where you may be exposed to hepatitis A. Hepatitis B vaccine   You may need this if you have certain conditions or if you travel or work in places where you may be exposed to hepatitis B. Haemophilus influenzae type b (Hib) vaccine  You may need this if you have certain conditions. You may receive vaccines as individual doses or as more than one vaccine together in one shot (combination vaccines). Talk with your health care provider about the risks and benefits of combination vaccines. What tests do I need? Blood tests  Lipid and cholesterol levels. These may be checked every 5 years, or more frequently depending on your overall health.  Hepatitis C test.  Hepatitis B test. Screening  Lung cancer screening. You may have this screening every year starting at age 67 if you have a 30-pack-year history of smoking and currently smoke or have quit within the past 15 years.  Colorectal cancer screening. All adults should have this screening starting at age 67 and continuing until age 54. Your health care provider may recommend screening at age 47 if you are at increased risk. You will have tests every 1-10 years, depending on your results and the type of screening test.  Prostate cancer screening. Recommendations will vary depending on your family history and other risks.  Diabetes screening. This is done by checking your blood sugar (glucose) after you have not eaten for a while (fasting). You may have this done every 1-3 years.  Abdominal aortic aneurysm (AAA) screening. You may need this if you are a current or former smoker.  Sexually transmitted disease (STD) testing. Follow these instructions at home: Eating and drinking  Eat  a diet that includes fresh fruits and vegetables, whole grains, lean protein, and low-fat dairy products. Limit your intake of foods with high amounts of sugar, saturated fats, and salt.  Take vitamin and mineral supplements as recommended by your health care provider.  Do not drink alcohol if your health care provider  tells you not to drink.  If you drink alcohol: ? Limit how much you have to 0-2 drinks a day. ? Be aware of how much alcohol is in your drink. In the [place] one drink equals one 12 oz bottle of beer (355 mL), one 5 oz glass of wine (148 mL), or one 1 oz glass of hard liquor (44 mL). Lifestyle  Take daily care of your teeth and gums.  Stay active. Exercise for at least 30 minutes on 5 or more days each week.  Do not use any products that contain nicotine or tobacco, such as cigarettes, e-cigarettes, and chewing tobacco. If you need help quitting, ask your health care provider.  If you are sexually active, practice safe sex. Use a condom or other form of protection to prevent STIs (sexually transmitted infections).  Talk with your health care provider about taking a low-dose aspirin or statin. What's next?  Visit your health care provider once a year for a well check visit.  Ask your health care provider how often you should have your eyes and teeth checked.  Stay up to date on all vaccines. This information is not intended to replace advice given to you by your health care provider. Make sure you discuss any questions you have with your health care provider. Document Released: 05/04/2015 Document Revised: 04/01/2018 Document Reviewed: 04/01/2018 Elsevier Patient Education  2020 [employer]  

## 2019-02-02 NOTE — Progress Notes (Signed)
MEDICARE ANNUAL WELLNESS VISIT  02/02/2019  Telephone Visit Disclaimer This Medicare AWV was conducted by telephone due to national recommendations for restrictions regarding the COVID-19 Pandemic (e.g. social distancing).  I verified, using two identifiers, that I am speaking with Jimmy Bonilla or their authorized healthcare agent. I discussed the limitations, risks, security, and privacy concerns of performing an evaluation and management service by telephone and the potential availability of an in-person appointment in the future. The patient expressed understanding and agreed to proceed.   Subjective:  Jimmy Bonilla is a 68 y.o. male patient of Bennie PieriniMartin, Mary-Margaret, FNP who had a Medicare Annual Wellness Visit today via telephone. Jimmy Bonilla is Retired and lives with their spouse. he has 2 children. he reports that he is socially active and does interact with friends/family regularly. he is moderately physically active and enjoys being on the [place], active in the [employer] community, wood working and exercising [facility] daily.  Patient Care Team: Bennie PieriniMartin, Mary-Margaret, FNP as PCP - General (Nurse Practitioner)  Advanced Directives 02/02/2019  Does Patient Have a Medical Advance Directive? No  Would patient like information on creating a medical advance directive? No - Patient declined    Hospital Utilization Over the Past 12 Months: # of hospitalizations or ER visits: 0 # of surgeries: 0  Review of Systems    Patient reports that his overall health is unchanged compared to last year.  History obtained from chart review  Patient Reported Readings (BP, Pulse, CBG, Weight, etc) none  Pain Assessment Pain : No/denies pain     Current Medications & Allergies (verified) Allergies as of 02/02/2019   No Known Allergies     Medication List       Accurate as of February 02, 2019  9:07 AM. If you have any questions, ask your nurse or doctor.         atorvastatin 10 MG tablet Commonly known as: LIPITOR Take 1 tablet (10 mg total) by mouth daily.   CENTRUM SILVER PO Take 1 tablet by mouth.   naproxen 500 MG tablet Commonly known as: NAPROSYN TAKE ONE TABLET TWICE A DAY WITH FOOD   sildenafil 20 MG tablet Commonly known as: REVATIO Take 1 tablet (20 mg total) by mouth 3 (three) times daily.   tamsulosin 0.4 MG Caps capsule Commonly known as: FLOMAX TAKE ONE (1) CAPSULE EACH DAY       History (reviewed): Past Medical History:  Diagnosis Date  . Enlarged prostate   . Erectile dysfunction   . Hyperlipidemia    Past Surgical History:  Procedure Laterality Date  . ANKLE FRACTURE SURGERY Left 1998  . COLONOSCOPY W/ POLYPECTOMY    . HERNIA REPAIR  2017   Family History  Problem Relation Age of Onset  . Diabetes Father   . Colon cancer Neg Hx    Social History   Socioeconomic History  . Marital status: Married    Spouse name: Erskine SquibbJane  . Number of children: 2  . Years of education: 6914  . Highest education level: Associate degree: academic program  Occupational History  . Occupation: retired  Engineer, productionocial Needs  . Financial resource strain: Not hard at all  . Food insecurity    Worry: Never true    Inability: Never true  . Transportation needs    Medical: No    Non-medical: No  Tobacco Use  . Smoking status: Never Smoker  . Smokeless tobacco: Never Used  Substance and Sexual Activity  .  Alcohol use: No  . Drug use: No  . Sexual activity: Not Currently  Lifestyle  . Physical activity    Days per week: 7 days    Minutes per session: 30 min  . Stress: Not at all  Relationships  . Social connections    Talks on phone: More than three times a week    Gets together: More than three times a week    Attends religious service: More than 4 times per year    Active member of club or organization: Yes    Attends meetings of clubs or organizations: More than 4 times per year    Relationship status: Married  Other  Topics Concern  . Not on file  Social History Narrative  . Not on file    Activities of Daily Living In your present state of health, do you have any difficulty performing the following activities: 02/02/2019  Hearing? N  Vision? N  Comment gets yearly eye exam  Difficulty concentrating or making decisions? N  Walking or climbing stairs? N  Dressing or bathing? N  Doing errands, shopping? N  Preparing Food and eating ? N  Using the Toilet? N  In the past six months, have you accidently leaked urine? N  Do you have problems with loss of bowel control? N  Managing your Medications? N  Managing your Finances? N  Housekeeping or managing your Housekeeping? N  Some recent data might be hidden    Patient Education/ Literacy How often do you need to have someone help you when you read instructions, pamphlets, or other written materials from your doctor or pharmacy?: 1 - Never What is the last grade level you completed in school?: [employer]  Exercise Current Exercise Habits: Home exercise routine, Type of exercise: Other - see comments(swimming), Time (Minutes): 30, Frequency (Times/Week): 7, Weekly Exercise (Minutes/Week): 210, Intensity: Moderate, Exercise limited by: None identified  Diet Patient reports consuming 3 meals a day and 1 snack(s) a day Patient reports that his primary diet is: Regular Patient reports that she does have regular access to food.   Depression Screen PHQ 2/9 Scores 02/02/2019 01/25/2019 05/31/2018 02/19/2018 01/13/2018 10/29/2017 09/23/2016  PHQ - 2 Score 0 0 0 0 0 0 0     Fall Risk Fall Risk  02/02/2019 01/25/2019 01/25/2019 05/31/2018 02/19/2018  Falls in the past year? 0 1 0 0 0  Number falls in past yr: 0 0 - - -  Injury with Fall? 0 1 - - -  Comment - Ankle pain and swelling - - -  Follow up Falls prevention discussed - - - -  Comment Get rid of all throw rugs in the house, adequate lighting in the walkways and grab bars in the bathroom - - - -      Objective:  Jimmy Bonilla seemed alert and oriented and he participated appropriately during our telephone visit.  Blood Pressure Weight BMI  BP Readings from Last 3 Encounters:  01/25/19 120/79  05/31/18 125/67  02/19/18 131/87   Wt Readings from Last 3 Encounters:  01/25/19 185 lb (83.9 kg)  05/31/18 184 lb (83.5 kg)  02/19/18 181 lb (82.1 kg)   BMI Readings from Last 1 Encounters:  01/25/19 27.32 kg/m    *Unable to obtain current vital signs, weight, and BMI due to telephone visit type  Hearing/Vision  . Elim did not seem to have difficulty with hearing/understanding during the telephone conversation . Reports that he has had a formal eye exam  by an eye care professional within the past year . Reports that he has not had a formal hearing evaluation within the past year *Unable to fully assess hearing and vision during telephone visit type  Cognitive Function: 6CIT Screen 02/02/2019  What Year? 0 points  What month? 0 points  What time? 0 points  Count back from 20 0 points  Months in reverse 0 points  Repeat phrase 0 points  Total Score 0   (Normal:0-7, Significant for Dysfunction: >8)  Normal Cognitive Function Screening: Yes   Immunization & Health Maintenance Record Immunization History  Administered Date(s) Administered  . Fluad Quad(high Dose 65+) 01/25/2019  . Influenza,inj,Quad PF,6+ Mos 02/14/2018  . Pneumococcal Conjugate-13 10/29/2017  . Pneumococcal Polysaccharide-23 02/06/1997, 01/25/2019  . Tdap 08/23/2009    Health Maintenance  Topic Date Due  . TETANUS/TDAP  08/24/2019  . COLONOSCOPY  12/23/2022  . INFLUENZA VACCINE  Completed  . Hepatitis C Screening  Completed  . PNA vac Low Risk Adult  Completed       Assessment  This is a routine wellness examination for Jimmy Devon.  Health Maintenance: Due or Overdue There are no preventive care reminders to display for this patient.  Jimmy Devon does not need a referral for Community  Assistance: Care Management:   no Social Work:    no Prescription Assistance:  no Nutrition/Diabetes Education:  no   Plan:  Personalized Goals Goals Addressed            This Visit's Progress   . DIET - INCREASE WATER INTAKE       Try to drink 6-8 glasses of water daily.      Personalized Health Maintenance & Screening Recommendations  Shingles vaccine  Lung Cancer Screening Recommended: no (Low Dose CT Chest recommended if Age 32-80 years, 30 pack-year currently smoking OR have quit w/in past 15 years) Hepatitis C Screening recommended: no HIV Screening recommended: no  Advanced Directives: Written information was not prepared per patient's request.  Referrals & Orders No orders of the defined types were placed in this encounter.   Follow-up Plan . Follow-up with Bennie Pierini, FNP as planned . Consider Shingles vaccine at your next visit with your PCP   I have personally reviewed and noted the following in the patient's chart:   . Medical and social history . Use of alcohol, tobacco or illicit drugs  . Current medications and supplements . Functional ability and status . Nutritional status . Physical activity . Advanced directives . List of other physicians . Hospitalizations, surgeries, and ER visits in previous 12 months . Vitals . Screenings to include cognitive, depression, and falls . Referrals and appointments  In addition, I have reviewed and discussed with Jimmy Devon certain preventive protocols, quality metrics, and best practice recommendations. A written personalized care plan for preventive services as well as general preventive health recommendations is available and can be mailed to the patient at his request.      Hessie Diener, LPN  [id number]

## 2019-02-08 ENCOUNTER — Ambulatory Visit (INDEPENDENT_AMBULATORY_CARE_PROVIDER_SITE_OTHER): Payer: Medicare HMO | Admitting: Nurse Practitioner

## 2019-02-08 ENCOUNTER — Encounter: Payer: Self-pay | Admitting: Nurse Practitioner

## 2019-02-08 DIAGNOSIS — L247 Irritant contact dermatitis due to plants, except food: Secondary | ICD-10-CM

## 2019-02-08 MED ORDER — PREDNISONE 10 MG (21) PO TBPK: ORAL_TABLET | ORAL | 0 refills | Status: DC

## 2019-02-08 NOTE — Progress Notes (Signed)
   Virtual Visit via telephone Note Due to COVID-19 pandemic this visit was conducted virtually. This visit type was conducted due to national recommendations for restrictions regarding the COVID-19 Pandemic (e.g. social distancing, sheltering in place) in an effort to limit this patient's exposure and mitigate transmission in our community. All issues noted in this document were discussed and addressed.  A physical exam was not performed with this format.  I connected with Jimmy Bonilla on 02/08/19 at 1:00 by telephone and verified that I am speaking with the correct person using two identifiers. Jimmy Bonilla is currently located at home and no one is currently with him during visit. The provider, Mary-Margaret Hassell Done, FNP is located in their office at time of visit.  I discussed the limitations, risks, security and privacy concerns of performing an evaluation and management service by telephone and the availability of in person appointments. I also discussed with the patient that there may be a patient responsible charge related to this service. The patient expressed understanding and agreed to proceed.   History and Present Illness:   Chief Complaint: Rash   HPI Patient has ben doing yard work and has gotten into poison ivy again. It is on both legs, buttocks and around belt line. Very itchy. This is the third time in the last 3  Months that he has had it.   Review of Systems  Constitutional: Negative for diaphoresis and weight loss.  Eyes: Negative for blurred vision, double vision and pain.  Respiratory: Negative for shortness of breath.   Cardiovascular: Negative for chest pain, palpitations, orthopnea and leg swelling.  Gastrointestinal: Negative for abdominal pain.  Skin: Negative for rash.  Neurological: Negative for dizziness, sensory change, loss of consciousness, weakness and headaches.  Endo/Heme/Allergies: Negative for polydipsia. Does not bruise/bleed easily.   Psychiatric/Behavioral: Negative for memory loss. The patient does not have insomnia.   All other systems reviewed and are negative.    Observations/Objective: Alert and oriented- answers all questions appropriately No distress erythematous vesicular patches on bil lower ext, bil buttocks and around the belt line   Assessment and Plan: Jimmy Bonilla in today with chief complaint of Rash   1. Irritant contact dermatitis due to plants, except food Benadryl OTC Calamine lotion Cool compresses Avoid scratching - predniSONE (STERAPRED UNI-PAK 21 TAB) 10 MG (21) TBPK tablet; As directed x 6 days  Dispense: 21 tablet; Refill: 0   Follow Up Instructions: prn    I discussed the assessment and treatment plan with the patient. The patient was provided an opportunity to ask questions and all were answered. The patient agreed with the plan and demonstrated an understanding of the instructions.   The patient was advised to call back or seek an in-person evaluation if the symptoms worsen or if the condition fails to improve as anticipated.  The above assessment and management plan was discussed with the patient. The patient verbalized understanding of and has agreed to the management plan. Patient is aware to call the clinic if symptoms persist or worsen. Patient is aware when to return to the clinic for a follow-up visit. Patient educated on when it is appropriate to go to the emergency department.   Time call ended:  1:10 I provided 10 minutes of non-face-to-face time during this encounter.    Mary-Margaret Hassell Done, FNP

## 2019-07-26 ENCOUNTER — Ambulatory Visit: Payer: Self-pay | Admitting: Nurse Practitioner

## 2019-08-05 ENCOUNTER — Other Ambulatory Visit: Payer: Self-pay | Admitting: Nurse Practitioner

## 2019-08-05 DIAGNOSIS — N529 Male erectile dysfunction, unspecified: Secondary | ICD-10-CM

## 2019-09-20 ENCOUNTER — Encounter: Payer: Self-pay | Admitting: Nurse Practitioner

## 2019-09-20 ENCOUNTER — Ambulatory Visit: Payer: Medicare HMO | Admitting: Nurse Practitioner

## 2019-09-20 ENCOUNTER — Other Ambulatory Visit: Payer: Self-pay

## 2019-09-20 VITALS — BP 129/80 | HR 56 | Temp 98.3°F | Resp 20 | Ht 69.0 in | Wt 184.0 lb

## 2019-09-20 DIAGNOSIS — I10 Essential (primary) hypertension: Secondary | ICD-10-CM | POA: Diagnosis not present

## 2019-09-20 DIAGNOSIS — Z6827 Body mass index (BMI) 27.0-27.9, adult: Secondary | ICD-10-CM | POA: Diagnosis not present

## 2019-09-20 DIAGNOSIS — N400 Enlarged prostate without lower urinary tract symptoms: Secondary | ICD-10-CM

## 2019-09-20 DIAGNOSIS — M7702 Medial epicondylitis, left elbow: Secondary | ICD-10-CM

## 2019-09-20 DIAGNOSIS — E7800 Pure hypercholesterolemia, unspecified: Secondary | ICD-10-CM

## 2019-09-20 DIAGNOSIS — N529 Male erectile dysfunction, unspecified: Secondary | ICD-10-CM

## 2019-09-20 MED ORDER — TAMSULOSIN HCL 0.4 MG PO CAPS: ORAL_CAPSULE | ORAL | 1 refills | Status: DC

## 2019-09-20 MED ORDER — SILDENAFIL CITRATE 20 MG PO TABS: ORAL_TABLET | ORAL | 2 refills | Status: DC

## 2019-09-20 MED ORDER — ATORVASTATIN CALCIUM 10 MG PO TABS: 10.0000 mg | ORAL_TABLET | Freq: Every day | ORAL | 1 refills | Status: DC

## 2019-09-20 NOTE — Progress Notes (Addendum)
Subjective:    Patient ID: Jimmy Bonilla, male    DOB: August 03, 1950, 69 y.o.   MRN: [id number]   Chief Complaint: Medical Management of Chronic Issues    HPI:  1. Essential hypertension No c/o chest pain, sob or headache. Does not check blood presure at home. BP Readings from Last 3 Encounters:  09/20/19 129/80  01/25/19 120/79  05/31/18 125/67     2. Pure hypercholesterolemia Does watch diet and stays very active Lab Results  Component Value Date   CHOL 163 01/25/2019   HDL 41 01/25/2019   LDLCALC 99 01/25/2019   TRIG 127 01/25/2019   CHOLHDL 4.0 01/25/2019      3. Benign prostatic hyperplasia without lower urinary tract symptoms No problem voiding. Is on flomax. Has not seen urology in over 2 years.   4. BMI 27.0-27.9,adult No weight changes Wt Readings from Last 3 Encounters:  09/20/19 184 lb (83.5 kg)  01/25/19 185 lb (83.9 kg)  05/31/18 184 lb (83.5 kg)   BMI Readings from Last 3 Encounters:  09/20/19 27.17 kg/m  01/25/19 27.32 kg/m  05/31/18 26.78 kg/m         Past Surgical History:  Procedure Laterality Date  . ANKLE FRACTURE SURGERY Left 1998  . COLONOSCOPY W/ POLYPECTOMY    . HERNIA REPAIR  2017    Family History  Problem Relation Age of Onset  . Diabetes Father   . Colon cancer Neg Hx     New complaints: sTill having left elbow pain. Has been going on for 4 weeks. Is some better using blue emu  Social history: Live with his wife. Is a scout master  Controlled substance contract: n/a    Review of Systems  Constitutional: Negative for diaphoresis.  Eyes: Negative for pain.  Respiratory: Negative for shortness of breath.   Cardiovascular: Negative for chest pain, palpitations and leg swelling.  Gastrointestinal: Negative for abdominal pain.  Endocrine: Negative for polydipsia.  Skin: Negative for rash.  Neurological: Negative for dizziness, weakness and headaches.  Hematological: Does not bruise/bleed easily.  All other  systems reviewed and are negative.      Objective:   Physical Exam Vitals and nursing note reviewed.  Constitutional:      Appearance: Normal appearance. He is well-developed.  HENT:     Head: Normocephalic.     Nose: Nose normal.  Eyes:     Pupils: Pupils are equal, round, and reactive to light.  Neck:     Thyroid: No thyroid mass or thyromegaly.     Vascular: No carotid bruit or JVD.     Trachea: Phonation normal.  Cardiovascular:     Rate and Rhythm: Normal rate and regular rhythm.  Pulmonary:     Effort: Pulmonary effort is normal. No respiratory distress.     Breath sounds: Normal breath sounds.  Abdominal:     General: Bowel sounds are normal.     Palpations: Abdomen is soft.     Tenderness: There is no abdominal tenderness.  Musculoskeletal:        General: Normal range of motion.     Cervical back: Normal range of motion and neck supple.     Comments: tenderness along medial aspect of left elbow  Lymphadenopathy:     Cervical: No cervical adenopathy.  Skin:    General: Skin is warm and dry.  Neurological:     Mental Status: He is alert and oriented to person, place, and time.  Psychiatric:  Behavior: Behavior normal.        Thought Content: Thought content normal.        Judgment: Judgment normal.    BP 129/80   Pulse (!) 56   Temp 98.3 F (36.8 C) (Temporal)   Resp 20   Ht 5' 9"  (1.753 m)   Wt 184 lb (83.5 kg)   SpO2 98%   BMI 27.17 kg/m         Assessment & Plan:  Tivis Wherry comes in today with chief complaint of Medical Management of Chronic Issues   Diagnosis and orders addressed:  1. Essential hypertension Low sodium diet - CBC with Differential/Platelet - CMP14+EGFR  2. Pure hypercholesterolemia Low fat diet - atorvastatin (LIPITOR) 10 MG tablet; Take 1 tablet (10 mg total) by mouth daily.  Dispense: 90 tablet; Refill: 1 - Lipid panel  3. Benign prostatic hyperplasia without lower urinary tract symptoms Report any  voiding isues - tamsulosin (FLOMAX) 0.4 MG CAPS capsule; TAKE ONE (1) CAPSULE EACH DAY  Dispense: 90 capsule; Refill: 1  4. BMI 27.0-27.9,adult Discussed diet and exercise for person with BMI >25 Will recheck weight in 3-6 months  5. Erectile dysfunction, unspecified erectile dysfunction type - sildenafil (REVATIO) 20 MG tablet; TAKE ONE (1) TABLET THREE (3) TIMES EACH DAY  Dispense: 50 tablet; Refill: 2  6. Golfer elbow elbow trap Continue motrin as rx.  Labs pending Health Maintenance reviewed Diet and exercise encouraged  Follow up plan: 6 month   [place], FNP

## 2019-09-20 NOTE — Patient Instructions (Signed)
Distal Biceps Tendinitis Rehab Ask your health care provider which exercises are safe for you. Do exercises exactly as told by your health care provider and adjust them as directed. It is normal to feel mild stretching, pulling, tightness, or discomfort as you do these exercises. Stop right away if you feel sudden pain or your pain gets worse. Do not begin these exercises until told by your health care provider. Stretching and range-of-motion exercises These exercises warm up your muscles and joints and improve the movement and flexibility of your arm. These exercises can also help to relieve pain and stiffness. Elbow range of motion 1. Stand or sit with your left / right elbow bent in a 90-degree angle (right angle). Position your forearm so that the thumb is facing the ceiling (neutral position). 2. Slowly straighten your elbow until you feel a stretch. 3. Hold this position for __________ seconds. 4. Slowly bend your elbow until you feel a stretch, or until you touch your thumb to your shoulder. 5. Hold this position for __________ seconds. Repeat __________ times. Complete this exercise __________ times a day. Forearm rotation, supination 1. Stand up or sit with your left / right elbow bent in a 90-degree angle (right angle). 2. Rotate your palm up until you cannot rotate it anymore (supination). Then, use your other hand to help turn your left / right forearm more. 3. Hold this position for __________ seconds. 4. Slowly return to the starting position. Repeat __________ times. Complete this exercise __________ times a day. Forearm rotation, pronation 1. Stand up or sit with your left / right elbow bent in a 90-degree angle (right angle). 2. Turn (rotate) your left / right palm down (pronation) until you cannot rotate it anymore. Then, use your other hand to help turn your left / right forearm more. 3. Hold this position for __________ seconds. 4. Slowly return to the starting  position. Repeat __________ times. Complete this exercise __________ times a day. Biceps stretch 1. Stand by a door frame. 2. Place your left / right hand on the door frame. Keep your elbow straight during the exercise. Gently turn your body toward the opposite side until you feel a gentle stretch. 3. Hold this position for __________ seconds. 4. Slowly return to the starting position. Repeat __________ times. Complete this exercise __________ times a day. Strengthening exercises These exercises build strength and endurance in your arm and shoulder. Endurance is the ability to use your muscles for a long time, even after they get tired. Forearm rotation, supination  1. Sit with your left / right forearm supported on a table. Your elbow should be at waist height. 2. Gently grasp a lightweight hammer near the head. As this exercise gets easier for you, try holding the hammer farther down the handle. 3. Rest your hand over the edge of the table, palm down. 4. Without moving your left / right elbow, slowly rotate your palm up (supination), stopping when your thumb is pointed toward the ceiling. 5. Hold this position for__________ seconds. 6. Slowly return to the starting position. Repeat __________ times. Complete this exercise __________ times a day. Forearm rotation, pronation  1. Sit with your left / right forearm supported on a table. Your elbow should be at waist height. 2. Gently grasp a lightweight hammer near the head. As this exercise gets easier for you, try holding the hammer farther down the handle. 3. Rest your hand over the edge of the table, palm up. 4. Without moving your left /   right elbow, slowly rotate your palm down (pronation), stopping when your thumb is pointed toward the floor. 5. Hold this position for __________ seconds. 6. Slowly return to the starting position. Repeat __________ times. Complete this exercise __________ times a day. Biceps curls  1. Sit on a  stable chair without armrests, or stand up. 2. If directed, hold a __________ weight in your left / right hand, or hold an exercise band with both hands. Your palms should face up toward the ceiling at the starting position. 3. Bend your left / right elbow and move your hand up toward your shoulder. Keep your other arm straight down, in the starting position. 4. Slowly return to the starting position. Repeat __________ times. Complete this exercise __________ times a day. Elbow extension, supine  1. Lie on your back (supine position). 2. Hold a __________ weight in your left / right hand. 3. Bend your left / right elbow to a 90-degree angle (right angle) so the weight is in front of your face, over your chest, and your elbow is pointed up to the ceiling. 4. Straighten your elbow, raising your hand toward the ceiling (extension). Use your other hand to support your left / right upper arm and to keep it still. 5. Slowly return to the starting position. Repeat __________ times. Complete this exercise __________ times a day. This information is not intended to replace advice given to you by your health care provider. Make sure you discuss any questions you have with your health care provider. Document Revised: 08/03/2018 Document Reviewed: 04/08/2018 [employer] Patient Education  2020 [employer]  

## 2019-09-21 LAB — CMP14+EGFR
ALT: 11 IU/L (ref 0–44)
AST: 16 IU/L (ref 0–40)
Albumin/Globulin Ratio: 2.1 (ref 1.2–2.2)
Albumin: 4.6 g/dL (ref 3.8–4.8)
Alkaline Phosphatase: 109 IU/L (ref 48–121)
BUN/Creatinine Ratio: 17 (ref 10–24)
BUN: 15 mg/dL (ref 8–27)
Bilirubin Total: 0.6 mg/dL (ref 0.0–1.2)
CO2: 24 mmol/L (ref 20–29)
Calcium: 9.8 mg/dL (ref 8.6–10.2)
Chloride: 100 mmol/L (ref 96–106)
Creatinine, Ser: 0.89 mg/dL (ref 0.76–1.27)
GFR calc Af Amer: 102 mL/min/{1.73_m2} (ref >59)
GFR calc non Af Amer: 88 mL/min/{1.73_m2} (ref >59)
Globulin, Total: 2.2 g/dL (ref 1.5–4.5)
Glucose: 85 mg/dL (ref 65–99)
Potassium: 4.4 mmol/L (ref 3.5–5.2)
Sodium: 143 mmol/L (ref 134–144)
Total Protein: 6.8 g/dL (ref 6.0–8.5)

## 2019-09-21 LAB — CBC WITH DIFFERENTIAL/PLATELET
Basophils Absolute: 0 10*3/uL (ref 0.0–0.2)
Basos: 1 %
EOS (ABSOLUTE): 0.1 10*3/uL (ref 0.0–0.4)
Eos: 2 %
Hematocrit: 45.5 % (ref 37.5–51.0)
Hemoglobin: 15.1 g/dL (ref 13.0–17.7)
Immature Grans (Abs): 0 10*3/uL (ref 0.0–0.1)
Immature Granulocytes: 0 %
Lymphocytes Absolute: 1.8 10*3/uL (ref 0.7–3.1)
Lymphs: 28 %
MCH: 31.7 pg (ref 26.6–33.0)
MCHC: 33.2 g/dL (ref 31.5–35.7)
MCV: 95 fL (ref 79–97)
Monocytes Absolute: 0.7 10*3/uL (ref 0.1–0.9)
Monocytes: 10 %
Neutrophils Absolute: 4 10*3/uL (ref 1.4–7.0)
Neutrophils: 59 %
Platelets: 267 10*3/uL (ref 150–450)
RBC: 4.77 x10E6/uL (ref 4.14–5.80)
RDW: 12.3 % (ref 11.6–15.4)
WBC: 6.6 10*3/uL (ref 3.4–10.8)

## 2019-09-21 LAB — LIPID PANEL
Chol/HDL Ratio: 3.8 ratio (ref 0.0–5.0)
Cholesterol, Total: 158 mg/dL (ref 100–199)
HDL: 42 mg/dL (ref >39)
LDL Chol Calc (NIH): 94 mg/dL (ref 0–99)
Triglycerides: 125 mg/dL (ref 0–149)
VLDL Cholesterol Cal: 22 mg/dL (ref 5–40)

## 2019-10-25 ENCOUNTER — Ambulatory Visit (INDEPENDENT_AMBULATORY_CARE_PROVIDER_SITE_OTHER): Payer: Medicare HMO | Admitting: Nurse Practitioner

## 2019-10-25 DIAGNOSIS — J029 Acute pharyngitis, unspecified: Secondary | ICD-10-CM

## 2019-10-25 MED ORDER — AMOXICILLIN-POT CLAVULANATE 875-125 MG PO TABS: 1.0000 | ORAL_TABLET | Freq: Two times a day (BID) | ORAL | 0 refills | Status: DC

## 2019-10-25 NOTE — Progress Notes (Signed)
° °  Virtual Visit via telephone Note Due to COVID-19 pandemic this visit was conducted virtually. This visit type was conducted due to national recommendations for restrictions regarding the COVID-19 Pandemic (e.g. social distancing, sheltering in place) in an effort to limit this patient's exposure and mitigate transmission in our community. All issues noted in this document were discussed and addressed.  A physical exam was not performed with this format.  I connected with Jimmy Bonilla on 10/25/19 at 9:55 by telephone and verified that I am speaking with the correct person using two identifiers. Jimmy Bonilla is currently located at home and his wife is currently with him during visit. The provider, Mary-Margaret Daphine Deutscher, FNP is located in their office at time of visit.  I discussed the limitations, risks, security and privacy concerns of performing an evaluation and management service by telephone and the availability of in person appointments. I also discussed with the patient that there may be a patient responsible charge related to this service. The patient expressed understanding and agreed to proceed.   History and Present Illness:   Chief Complaint: Sore Throat   HPI Calls in c/o sore throat for the last 3 days. Is some better, his voice is hoarse. His wife is having a hysterectomy next week and he does not want to make her sick,   Review of Systems  Constitutional: Negative for diaphoresis and weight loss.  HENT: Positive for congestion and sore throat.   Eyes: Negative for blurred vision, double vision and pain.  Respiratory: Negative for shortness of breath.   Cardiovascular: Negative for chest pain, palpitations, orthopnea and leg swelling.  Gastrointestinal: Negative for abdominal pain.  Skin: Negative for rash.  Neurological: Negative for dizziness, sensory change, loss of consciousness, weakness and headaches.  Endo/Heme/Allergies: Negative for polydipsia. Does not  bruise/bleed easily.  Psychiatric/Behavioral: Negative for memory loss. The patient does not have insomnia.   All other systems reviewed and are negative.    Observations/Objective: Alert and oriented- answers all questions appropriately No distress hoarse  Assessment and Plan: Jimmy Bonilla in today with chief complaint of Sore Throat   1. Sore throat Force fluids Motrin or tylenol OTC OTC decongestant Throat lozenges if help New toothbrush in 3 days Only treating because his wife is having surgery and if he does have strep dont want her to get it. - amoxicillin-clavulanate (AUGMENTIN) 875-125 MG tablet; Take 1 tablet by mouth 2 (two) times daily.  Dispense: 14 tablet; Refill: 0     Follow Up Instructions: prn    I discussed the assessment and treatment plan with the patient. The patient was provided an opportunity to ask questions and all were answered. The patient agreed with the plan and demonstrated an understanding of the instructions.   The patient was advised to call back or seek an in-person evaluation if the symptoms worsen or if the condition fails to improve as anticipated.  The above assessment and management plan was discussed with the patient. The patient verbalized understanding of and has agreed to the management plan. Patient is aware to call the clinic if symptoms persist or worsen. Patient is aware when to return to the clinic for a follow-up visit. Patient educated on when it is appropriate to go to the emergency department.   Time call ended:  10:10  I provided 15 minutes of non-face-to-face time during this encounter.    Mary-Margaret Daphine Deutscher, FNP

## 2019-10-27 ENCOUNTER — Telehealth: Payer: Self-pay | Admitting: Nurse Practitioner

## 2019-10-27 NOTE — Telephone Encounter (Signed)
Pt states that he is on day 4 of his antibiotic and is feeling worse instead of better. Throat is raw and dry, ears hurt. No fever. Requesting to speak with a nurse.

## 2019-10-27 NOTE — Telephone Encounter (Signed)
Try humidifier, nasal spray, allergy medication for dryness and head pressure.Antibiotic will not cure virus but continue with medication.

## 2020-02-03 ENCOUNTER — Ambulatory Visit: Payer: Medicare HMO

## 2020-03-21 ENCOUNTER — Ambulatory Visit: Payer: Self-pay | Admitting: Nurse Practitioner

## 2020-05-24 ENCOUNTER — Other Ambulatory Visit: Payer: Self-pay | Admitting: Nurse Practitioner

## 2020-05-24 DIAGNOSIS — N400 Enlarged prostate without lower urinary tract symptoms: Secondary | ICD-10-CM

## 2020-08-21 ENCOUNTER — Other Ambulatory Visit: Payer: Self-pay | Admitting: Nurse Practitioner

## 2020-08-21 DIAGNOSIS — N400 Enlarged prostate without lower urinary tract symptoms: Secondary | ICD-10-CM

## 2020-08-21 DIAGNOSIS — E7800 Pure hypercholesterolemia, unspecified: Secondary | ICD-10-CM

## 2020-12-10 ENCOUNTER — Other Ambulatory Visit: Payer: Self-pay | Admitting: Nurse Practitioner

## 2020-12-10 ENCOUNTER — Telehealth: Payer: Self-pay | Admitting: Nurse Practitioner

## 2020-12-10 DIAGNOSIS — N400 Enlarged prostate without lower urinary tract symptoms: Secondary | ICD-10-CM

## 2020-12-10 DIAGNOSIS — E7800 Pure hypercholesterolemia, unspecified: Secondary | ICD-10-CM

## 2020-12-10 NOTE — Telephone Encounter (Signed)
Being requested via escripts. Pt's last OV was 09/20/19 had to be routed to provider for approval since last visit is greater than a year

## 2020-12-10 NOTE — Telephone Encounter (Signed)
  Prescription Request  12/10/2020  Is this a "Controlled Substance" medicine? no Have you seen your PCP in the last 2 weeks?appt thursday If YES, route message to pool  -  If NO, patient needs to be seen.  What is the name of the medication or equipment? Flomax and lipitor  Have you contacted your pharmacy to request a refill? yes  Which pharmacy would you like this sent to?[facility]   Patient notified that their request is being sent to the clinical staff for review and that they should receive a response within 2 business days.

## 2020-12-11 NOTE — Telephone Encounter (Signed)
Aware refills sent to pharmacy, see him at his appt on Thursday

## 2020-12-13 ENCOUNTER — Other Ambulatory Visit: Payer: Self-pay

## 2020-12-13 ENCOUNTER — Encounter: Payer: Self-pay | Admitting: Nurse Practitioner

## 2020-12-13 ENCOUNTER — Ambulatory Visit (INDEPENDENT_AMBULATORY_CARE_PROVIDER_SITE_OTHER): Payer: Medicare HMO | Admitting: Nurse Practitioner

## 2020-12-13 VITALS — BP 124/86 | HR 56 | Temp 97.9°F | Resp 20 | Ht 69.0 in | Wt 186.0 lb

## 2020-12-13 DIAGNOSIS — I10 Essential (primary) hypertension: Secondary | ICD-10-CM

## 2020-12-13 DIAGNOSIS — N400 Enlarged prostate without lower urinary tract symptoms: Secondary | ICD-10-CM

## 2020-12-13 DIAGNOSIS — Z6827 Body mass index (BMI) 27.0-27.9, adult: Secondary | ICD-10-CM

## 2020-12-13 DIAGNOSIS — E7800 Pure hypercholesterolemia, unspecified: Secondary | ICD-10-CM | POA: Diagnosis not present

## 2020-12-13 DIAGNOSIS — N529 Male erectile dysfunction, unspecified: Secondary | ICD-10-CM

## 2020-12-13 MED ORDER — TAMSULOSIN HCL 0.4 MG PO CAPS: ORAL_CAPSULE | ORAL | 1 refills | Status: DC

## 2020-12-13 MED ORDER — ATORVASTATIN CALCIUM 10 MG PO TABS: 10.0000 mg | ORAL_TABLET | Freq: Every day | ORAL | 1 refills | Status: DC

## 2020-12-13 MED ORDER — SILDENAFIL CITRATE 20 MG PO TABS: ORAL_TABLET | ORAL | 2 refills | Status: DC

## 2020-12-13 NOTE — Patient Instructions (Signed)
https://www.nhlbi.nih.gov/files/docs/public/heart/dash_brief.pdf">  DASH Eating Plan DASH stands for Dietary Approaches to Stop Hypertension. The DASH eating plan is a healthy eating plan that has been shown to: Reduce high blood pressure (hypertension). Reduce your risk for type 2 diabetes, heart disease, and stroke. Help with weight loss. What are tips for following this plan? Reading food labels Check food labels for the amount of salt (sodium) per serving. Choose foods with less than 5 percent of the Daily Value of sodium. Generally, foods with less than 300 milligrams (mg) of sodium per serving fit into this eating plan. To find whole grains, look for the word "whole" as the first word in the ingredient list. Shopping Buy products labeled as "low-sodium" or "no salt added." Buy fresh foods. Avoid canned foods and pre-made or frozen meals. Cooking Avoid adding salt when cooking. Use salt-free seasonings or herbs instead of table salt or sea salt. Check with your health care provider or pharmacist before using salt substitutes. Do not fry foods. Cook foods using healthy methods such as baking, boiling, grilling, roasting, and broiling instead. Cook with heart-healthy oils, such as olive, canola, avocado, soybean, or sunflower oil. Meal planning  Eat a balanced diet that includes: 4 or more servings of fruits and 4 or more servings of vegetables each day. Try to fill one-half of your plate with fruits and vegetables. 6-8 servings of whole grains each day. Less than 6 oz (170 g) of lean meat, poultry, or fish each day. A 3-oz (85-g) serving of meat is about the same size as a deck of cards. One egg equals 1 oz (28 g). 2-3 servings of low-fat dairy each day. One serving is 1 cup (237 mL). 1 serving of nuts, seeds, or beans 5 times each week. 2-3 servings of heart-healthy fats. Healthy fats called omega-3 fatty acids are found in foods such as walnuts, flaxseeds, fortified milks, and eggs.  These fats are also found in cold-water fish, such as sardines, salmon, and mackerel. Limit how much you eat of: Canned or prepackaged foods. Food that is high in trans fat, such as some fried foods. Food that is high in saturated fat, such as fatty meat. Desserts and other sweets, sugary drinks, and other foods with added sugar. Full-fat dairy products. Do not salt foods before eating. Do not eat more than 4 egg yolks a week. Try to eat at least 2 vegetarian meals a week. Eat more home-cooked food and less restaurant, buffet, and fast food.  Lifestyle When eating at a restaurant, ask that your food be prepared with less salt or no salt, if possible. If you drink alcohol: Limit how much you use to: 0-1 drink a day for women who are not pregnant. 0-2 drinks a day for men. Be aware of how much alcohol is in your drink. In the [place] one drink equals one 12 oz bottle of beer (355 mL), one 5 oz glass of wine (148 mL), or one 1 oz glass of hard liquor (44 mL). General information Avoid eating more than 2,300 mg of salt a day. If you have hypertension, you may need to reduce your sodium intake to 1,500 mg a day. Work with your health care provider to maintain a healthy body weight or to lose weight. Ask what an ideal weight is for you. Get at least 30 minutes of exercise that causes your heart to beat faster (aerobic exercise) most days of the week. Activities may include walking, swimming, or biking. Work with your health care provider   or dietitian to adjust your eating plan to your individual calorie needs. What foods should I eat? Fruits All fresh, dried, or frozen fruit. Canned fruit in natural juice (without addedsugar). Vegetables Fresh or frozen vegetables (raw, steamed, roasted, or grilled). Low-sodium or reduced-sodium tomato and vegetable juice. Low-sodium or reduced-sodium tomatosauce and tomato paste. Low-sodium or reduced-sodium canned vegetables. Grains Whole-grain or  whole-wheat bread. Whole-grain or whole-wheat pasta. Brown rice. Oatmeal. Quinoa. Bulgur. Whole-grain and low-sodium cereals. Pita bread.Low-fat, low-sodium crackers. Whole-wheat flour tortillas. Meats and other proteins Skinless chicken or turkey. Ground chicken or turkey. Pork with fat trimmed off. Fish and seafood. Egg whites. Dried beans, peas, or lentils. Unsalted nuts, nut butters, and seeds. Unsalted canned beans. Lean cuts of beef with fat trimmed off. Low-sodium, lean precooked or cured meat, such as sausages or meatloaves. Dairy Low-fat (1%) or fat-free (skim) milk. Reduced-fat, low-fat, or fat-free cheeses. Nonfat, low-sodium ricotta or cottage cheese. Low-fat or nonfatyogurt. Low-fat, low-sodium cheese. Fats and oils Soft margarine without trans fats. Vegetable oil. Reduced-fat, low-fat, or light mayonnaise and salad dressings (reduced-sodium). Canola, safflower, olive, avocado, soybean, andsunflower oils. Avocado. Seasonings and condiments Herbs. Spices. Seasoning mixes without salt. Other foods Unsalted popcorn and pretzels. Fat-free sweets. The items listed above may not be a complete list of foods and beverages you can eat. Contact a dietitian for more information. What foods should I avoid? Fruits Canned fruit in a light or heavy syrup. Fried fruit. Fruit in cream or buttersauce. Vegetables Creamed or fried vegetables. Vegetables in a cheese sauce. Regular canned vegetables (not low-sodium or reduced-sodium). Regular canned tomato sauce and paste (not low-sodium or reduced-sodium). Regular tomato and vegetable juice(not low-sodium or reduced-sodium). Pickles. Olives. Grains Baked goods made with fat, such as croissants, muffins, or some breads. Drypasta or rice meal packs. Meats and other proteins Fatty cuts of meat. Ribs. Fried meat. Bacon. Bologna, salami, and other precooked or cured meats, such as sausages or meat loaves. Fat from the back of a pig (fatback). Bratwurst.  Salted nuts and seeds. Canned beans with added salt. Canned orsmoked fish. Whole eggs or egg yolks. Chicken or turkey with skin. Dairy Whole or 2% milk, cream, and half-and-half. Whole or full-fat cream cheese. Whole-fat or sweetened yogurt. Full-fat cheese. Nondairy creamers. Whippedtoppings. Processed cheese and cheese spreads. Fats and oils Butter. Stick margarine. Lard. Shortening. Ghee. Bacon fat. Tropical oils, suchas coconut, palm kernel, or palm oil. Seasonings and condiments Onion salt, garlic salt, seasoned salt, table salt, and sea salt. Worcestershire sauce. Tartar sauce. Barbecue sauce. Teriyaki sauce. Soy sauce, including reduced-sodium. Steak sauce. Canned and packaged gravies. Fish sauce. Oyster sauce. Cocktail sauce. Store-bought horseradish. Ketchup. Mustard. Meat flavorings and tenderizers. Bouillon cubes. Hot sauces. Pre-made or packaged marinades. Pre-made or packaged taco seasonings. Relishes. Regular saladdressings. Other foods Salted popcorn and pretzels. The items listed above may not be a complete list of foods and beverages you should avoid. Contact a dietitian for more information. Where to find more information National Heart, Lung, and Blood Institute: www.nhlbi.nih.gov American Heart Association: www.heart.org Academy of Nutrition and Dietetics: www.eatright.org National Kidney Foundation: www.kidney.org Summary The DASH eating plan is a healthy eating plan that has been shown to reduce high blood pressure (hypertension). It may also reduce your risk for type 2 diabetes, heart disease, and stroke. When on the DASH eating plan, aim to eat more fresh fruits and vegetables, whole grains, lean proteins, low-fat dairy, and heart-healthy fats. With the DASH eating plan, you should limit salt (sodium) intake to 2,300   mg a day. If you have hypertension, you may need to reduce your sodium intake to 1,500 mg a day. Work with your health care provider or dietitian to adjust  your eating plan to your individual calorie needs. This information is not intended to replace advice given to you by your health care provider. Make sure you discuss any questions you have with your healthcare provider. Document Revised: 03/11/2019 Document Reviewed: 03/11/2019 Elsevier Patient Education  2022 Elsevier Inc.  

## 2020-12-13 NOTE — Progress Notes (Signed)
Subjective:    Patient ID: Jimmy Bonilla, male    DOB: 01-13-1951, 70 y.o.   MRN: [id number]   Chief Complaint: medical management of chronic issues     HPI:  1. Primary hypertension No c/o chest pain, sob or headache. Does not check blood pressure at home. BP Readings from Last 3 Encounters:  09/20/19 129/80  01/25/19 120/79  05/31/18 125/67     2. Pure hypercholesterolemia Does try to watch diet. Stays very active and exercises daily. Lab Results  Component Value Date   CHOL 158 09/20/2019   HDL 42 09/20/2019   LDLCALC 94 09/20/2019   TRIG 125 09/20/2019   CHOLHDL 3.8 09/20/2019     3. Benign prostatic hyperplasia without lower urinary tract symptoms Denies any problems voiding. Is on flomax. Sees urology yearly.  4. BMI 27.0-27.9,adult No recent weight changes  Wt Readings from Last 3 Encounters:  12/13/20 186 lb (84.4 kg)  09/20/19 184 lb (83.5 kg)  01/25/19 185 lb (83.9 kg)   BMI Readings from Last 3 Encounters:  12/13/20 27.47 kg/m  09/20/19 27.17 kg/m  01/25/19 27.32 kg/m      Outpatient Encounter Medications as of 12/13/2020  Medication Sig   amoxicillin-clavulanate (AUGMENTIN) 875-125 MG tablet Take 1 tablet by mouth 2 (two) times daily.   atorvastatin (LIPITOR) 10 MG tablet TAKE ONE (1) TABLET EACH DAY   Multiple Vitamins-Minerals (CENTRUM SILVER PO) Take 1 tablet by mouth.   naproxen (NAPROSYN) 500 MG tablet TAKE ONE TABLET TWICE A DAY WITH FOOD   sildenafil (REVATIO) 20 MG tablet TAKE ONE (1) TABLET THREE (3) TIMES EACH DAY   tamsulosin (FLOMAX) 0.4 MG CAPS capsule TAKE ONE (1) CAPSULE EACH DAY   No facility-administered encounter medications on file as of 12/13/2020.    Past Surgical History:  Procedure Laterality Date   ANKLE FRACTURE SURGERY Left 1998   COLONOSCOPY W/ POLYPECTOMY     HERNIA REPAIR  2017    Family History  Problem Relation Age of Onset   Diabetes Father    Colon cancer Neg Hx     New complaints: None  today  Social history: Lives his wife. Is very active in [employer]  Controlled substance contract: n/a      Review of Systems  Constitutional:  Negative for diaphoresis.  Eyes:  Negative for pain.  Respiratory:  Negative for shortness of breath.   Cardiovascular:  Negative for chest pain, palpitations and leg swelling.  Gastrointestinal:  Negative for abdominal pain.  Endocrine: Negative for polydipsia.  Skin:  Negative for rash.  Neurological:  Negative for dizziness, weakness and headaches.  Hematological:  Does not bruise/bleed easily.  All other systems reviewed and are negative.     Objective:   Physical Exam Vitals and nursing note reviewed.  Constitutional:      Appearance: Normal appearance. He is well-developed.  HENT:     Head: Normocephalic.     Nose: Nose normal.  Eyes:     Pupils: Pupils are equal, round, and reactive to light.  Neck:     Thyroid: No thyroid mass or thyromegaly.     Vascular: No carotid bruit or JVD.     Trachea: Phonation normal.  Cardiovascular:     Rate and Rhythm: Normal rate and regular rhythm.  Pulmonary:     Effort: Pulmonary effort is normal. No respiratory distress.     Breath sounds: Normal breath sounds.  Abdominal:     General: Bowel sounds are normal.  Palpations: Abdomen is soft.     Tenderness: There is no abdominal tenderness.  Musculoskeletal:        General: Normal range of motion.     Cervical back: Normal range of motion and neck supple.     Left lower leg: Edema (1+) present.  Lymphadenopathy:     Cervical: No cervical adenopathy.  Skin:    General: Skin is warm and dry.  Neurological:     Mental Status: He is alert and oriented to person, place, and time.  Psychiatric:        Behavior: Behavior normal.        Thought Content: Thought content normal.        Judgment: Judgment normal.    BP 124/86   Pulse (!) 56   Temp 97.9 F (36.6 C) (Temporal)   Resp 20   Ht 5' 9"  (1.753 m)   Wt 186 lb (84.4  kg)   SpO2 98%   BMI 27.47 kg/m        Assessment & Plan:  Jimmy Bonilla in today with chief complaint of Medical Management of Chronic Issues   1. Primary hypertension Low sodium diet - CBC with Differential/Platelet - CMP14+EGFR  2. Pure hypercholesterolemia Low fat diet - Lipid panel  3. Benign prostatic hyperplasia without lower urinary tract symptoms Needs to follow up with urology - PSA, total and free  4. BMI 27.0-27.9,adult Discussed diet and exercise for person with BMI >25 Will recheck weight in 3-6 months     The above assessment and management plan was discussed with the patient. The patient verbalized understanding of and has agreed to the management plan. Patient is aware to call the clinic if symptoms persist or worsen. Patient is aware when to return to the clinic for a follow-up visit. Patient educated on when it is appropriate to go to the emergency department.   Mary-Margaret Hassell Done, FNP

## 2021-01-16 ENCOUNTER — Telehealth: Payer: Self-pay | Admitting: Nurse Practitioner

## 2021-01-16 NOTE — Telephone Encounter (Signed)
Left message for patient to call back and schedule Medicare Annual Wellness Visit (AWV).   Please offer to do telephone call.  Left office number and my jabber [phone].  Last AWV:02/02/2019  Please schedule at anytime with Nurse Health Advisor.

## 2021-01-22 ENCOUNTER — Ambulatory Visit (INDEPENDENT_AMBULATORY_CARE_PROVIDER_SITE_OTHER): Payer: Medicare HMO

## 2021-01-22 VITALS — Ht 69.0 in | Wt 186.0 lb

## 2021-01-22 DIAGNOSIS — Z0000 Encounter for general adult medical examination without abnormal findings: Secondary | ICD-10-CM | POA: Diagnosis not present

## 2021-01-22 NOTE — Patient Instructions (Signed)
Jimmy Bonilla , Thank you for taking time to come for your Medicare Wellness Visit. I appreciate your ongoing commitment to your health goals. Please review the following plan we discussed and let me know if I can assist you in the future.   Screening recommendations/referrals: Colonoscopy: Done 12/22/2012 - Repeat in 10 years Recommended yearly ophthalmology/optometry visit for glaucoma screening and checkup Recommended yearly dental visit for hygiene and checkup  Vaccinations: Influenza vaccine: Due every fall Pneumococcal vaccine: Done 10/29/2017 & 01/25/2019 Tdap vaccine: Done 08/23/2009 - Repeat in 10 years *due Shingles vaccine: Due. Shingrix discussed. Please contact your pharmacy for coverage information.     Covid-19[name] Done 07/24/2019 - due for boosters (declined)  Advanced directives: Please bring a copy of your health care power of attorney and living will to the office to be added to your chart at your convenience.   Conditions/risks identified: Aim for 30 minutes of exercise or brisk walking each day, drink 6-8 glasses of water and eat lots of fruits and vegetables.   Next appointment: Follow up in one year for your annual wellness visit.   Preventive Care 69 Years and Older, Male  Preventive care refers to lifestyle choices and visits with your health care provider that can promote health and wellness. What does preventive care include? A yearly physical exam. This is also called an annual well check. Dental exams once or twice a year. Routine eye exams. Ask your health care provider how often you should have your eyes checked. Personal lifestyle choices, including: Daily care of your teeth and gums. Regular physical activity. Eating a healthy diet. Avoiding tobacco and drug use. Limiting alcohol use. Practicing safe sex. Taking low doses of aspirin every day. Taking vitamin and mineral supplements as recommended by your health care provider. What happens during an  annual well check? The services and screenings done by your health care provider during your annual well check will depend on your age, overall health, lifestyle risk factors, and family history of disease. Counseling  Your health care provider may ask you questions about your: Alcohol use. Tobacco use. Drug use. Emotional well-being. Home and relationship well-being. Sexual activity. Eating habits. History of falls. Memory and ability to understand (cognition). Work and work Astronomer. Screening  You may have the following tests or measurements: Height, weight, and BMI. Blood pressure. Lipid and cholesterol levels. These may be checked every 5 years, or more frequently if you are over 75 years old. Skin check. Lung cancer screening. You may have this screening every year starting at age 4 if you have a 30-pack-year history of smoking and currently smoke or have quit within the past 15 years. Fecal occult blood test (FOBT) of the stool. You may have this test every year starting at age 51. Flexible sigmoidoscopy or colonoscopy. You may have a sigmoidoscopy every 5 years or a colonoscopy every 10 years starting at age 28. Prostate cancer screening. Recommendations will vary depending on your family history and other risks. Hepatitis C blood test. Hepatitis B blood test. Sexually transmitted disease (STD) testing. Diabetes screening. This is done by checking your blood sugar (glucose) after you have not eaten for a while (fasting). You may have this done every 1-3 years. Abdominal aortic aneurysm (AAA) screening. You may need this if you are a current or former smoker. Osteoporosis. You may be screened starting at age 66 if you are at high risk. Talk with your health care provider about your test results, treatment options, and if  necessary, the need for more tests. Vaccines  Your health care provider may recommend certain vaccines, such as: Influenza vaccine. This is recommended  every year. Tetanus, diphtheria, and acellular pertussis (Tdap, Td) vaccine. You may need a Td booster every 10 years. Zoster vaccine. You may need this after age 16. Pneumococcal 13-valent conjugate (PCV13) vaccine. One dose is recommended after age 59. Pneumococcal polysaccharide (PPSV23) vaccine. One dose is recommended after age 9. Talk to your health care provider about which screenings and vaccines you need and how often you need them. This information is not intended to replace advice given to you by your health care provider. Make sure you discuss any questions you have with your health care provider. Document Released: 05/04/2015 Document Revised: 12/26/2015 Document Reviewed: 02/06/2015 Elsevier Interactive Patient Education  2017 [employer] Prevention in the Home Falls can cause injuries. They can happen to people of all ages. There are many things you can do to make your home safe and to help prevent falls. What can I do on the outside of my home? Regularly fix the edges of walkways and driveways and fix any cracks. Remove anything that might make you trip as you walk through a door, such as a raised step or threshold. Trim any bushes or trees on the path to your home. Use bright outdoor lighting. Clear any walking paths of anything that might make someone trip, such as rocks or tools. Regularly check to see if handrails are loose or broken. Make sure that both sides of any steps have handrails. Any raised decks and porches should have guardrails on the edges. Have any leaves, snow, or ice cleared regularly. Use sand or salt on walking paths during winter. Clean up any spills in your garage right away. This includes oil or grease spills. What can I do in the bathroom? Use night lights. Install grab bars by the toilet and in the tub and shower. Do not use towel bars as grab bars. Use non-skid mats or decals in the tub or shower. If you need to sit down in the shower,  use a plastic, non-slip stool. Keep the floor dry. Clean up any water that spills on the floor as soon as it happens. Remove soap buildup in the tub or shower regularly. Attach bath mats securely with double-sided non-slip rug tape. Do not have throw rugs and other things on the floor that can make you trip. What can I do in the bedroom? Use night lights. Make sure that you have a light by your bed that is easy to reach. Do not use any sheets or blankets that are too big for your bed. They should not hang down onto the floor. Have a firm chair that has side arms. You can use this for support while you get dressed. Do not have throw rugs and other things on the floor that can make you trip. What can I do in the kitchen? Clean up any spills right away. Avoid walking on wet floors. Keep items that you use a lot in easy-to-reach places. If you need to reach something above you, use a strong step stool that has a grab bar. Keep electrical cords out of the way. Do not use floor polish or wax that makes floors slippery. If you must use wax, use non-skid floor wax. Do not have throw rugs and other things on the floor that can make you trip. What can I do with my stairs? Do not leave any items  on the stairs. Make sure that there are handrails on both sides of the stairs and use them. Fix handrails that are broken or loose. Make sure that handrails are as long as the stairways. Check any carpeting to make sure that it is firmly attached to the stairs. Fix any carpet that is loose or worn. Avoid having throw rugs at the top or bottom of the stairs. If you do have throw rugs, attach them to the floor with carpet tape. Make sure that you have a light switch at the top of the stairs and the bottom of the stairs. If you do not have them, ask someone to add them for you. What else can I do to help prevent falls? Wear shoes that: Do not have high heels. Have rubber bottoms. Are comfortable and fit you  well. Are closed at the toe. Do not wear sandals. If you use a stepladder: Make sure that it is fully opened. Do not climb a closed stepladder. Make sure that both sides of the stepladder are locked into place. Ask someone to hold it for you, if possible. Clearly mark and make sure that you can see: Any grab bars or handrails. First and last steps. Where the edge of each step is. Use tools that help you move around (mobility aids) if they are needed. These include: Canes. Walkers. Scooters. Crutches. Turn on the lights when you go into a dark area. Replace any light bulbs as soon as they burn out. Set up your furniture so you have a clear path. Avoid moving your furniture around. If any of your floors are uneven, fix them. If there are any pets around you, be aware of where they are. Review your medicines with your doctor. Some medicines can make you feel dizzy. This can increase your chance of falling. Ask your doctor what other things that you can do to help prevent falls. This information is not intended to replace advice given to you by your health care provider. Make sure you discuss any questions you have with your health care provider. Document Released: 02/01/2009 Document Revised: 09/13/2015 Document Reviewed: 05/12/2014 Elsevier Interactive Patient Education  2017 [employer].

## 2021-01-22 NOTE — Progress Notes (Signed)
Subjective:   Jimmy Bonilla is a 70 y.o. male who presents for Medicare Annual/Subsequent preventive examination.  Virtual Visit via Telephone Note  I connected with  Jimmy Bonilla on 01/22/21 at  4:15 PM EDT by telephone and verified that I am speaking with the correct person using two identifiers.  Location: Patient: Home Provider: [facility] Persons participating in the virtual visit: patient/Nurse Health Advisor   I discussed the limitations, risks, security and privacy concerns of performing an evaluation and management service by telephone and the availability of in person appointments. The patient expressed understanding and agreed to proceed.  Interactive audio and video telecommunications were attempted between this nurse and patient, however failed, due to patient having technical difficulties OR patient did not have access to video capability.  We continued and completed visit with audio only.  Some vital signs may be absent or patient reported.   Anvitha Hutmacher E Yezenia Fredrick, LPN   Review of Systems     Cardiac Risk Factors include: advanced age (>29men, >63 women);dyslipidemia;male gender     Objective:    Today's Vitals   01/22/21 1608 01/22/21 1609  Weight: 186 lb (84.4 kg)   Height: 5\' 9"  (1.753 m)   PainSc:  5    Body mass index is 27.47 kg/m.  Advanced Directives 01/22/2021 02/02/2019  Does Patient Have a Medical Advance Directive? Yes No  Type of 02/04/2019 of [place];Living will -  Copy of Healthcare Power of Attorney in Chart? No - copy requested -  Would patient like information on creating a medical advance directive? - No - Patient declined    Current Medications (verified) Outpatient Encounter Medications as of 01/22/2021  Medication Sig   atorvastatin (LIPITOR) 10 MG tablet Take 1 tablet (10 mg total) by mouth daily.   Multiple Vitamins-Minerals (CENTRUM SILVER PO) Take 1 tablet by mouth.   naproxen (NAPROSYN) 500 MG tablet TAKE ONE  TABLET TWICE A DAY WITH FOOD   sildenafil (REVATIO) 20 MG tablet TAKE ONE (1) TABLET THREE (3) TIMES EACH DAY   tamsulosin (FLOMAX) 0.4 MG CAPS capsule TAKE ONE (1) CAPSULE EACH DAY   No facility-administered encounter medications on file as of 01/22/2021.    Allergies (verified) Patient has no known allergies.   History: Past Medical History:  Diagnosis Date   Enlarged prostate    Erectile dysfunction    Hyperlipidemia    Past Surgical History:  Procedure Laterality Date   ANKLE FRACTURE SURGERY Left 1998   COLONOSCOPY W/ POLYPECTOMY     HERNIA REPAIR  2017   Family History  Problem Relation Age of Onset   Diabetes Father    Colon cancer Neg Hx    Social History   Socioeconomic History   Marital status: Married    Spouse name: 2018   Number of children: 2   Years of education: 14   Highest education level: Associate degree: academic program  Occupational History   Occupation: retired  Tobacco Use   Smoking status: Never   Smokeless tobacco: Never  Erskine Squibb Use: Never used  Substance and Sexual Activity   Alcohol use: No   Drug use: No   Sexual activity: Not Currently  Other Topics Concern   Not on file  Social History Narrative   Not on file   Social Determinants of Health   Financial Resource Strain: Low Risk    Difficulty of Paying Living Expenses: Not hard at all  Food Insecurity: No Food Insecurity  Worried About Programme researcher, broadcasting/film/video in the Last Year: Never true   [employer] of Food in the Last Year: Never true  Transportation Needs: No Transportation Needs   Lack of Transportation (Medical): No   Lack of Transportation (Non-Medical): No  Physical Activity: Sufficiently Active   Days of Exercise per Week: 4 days   Minutes of Exercise per Session: 60 min  Stress: No Stress Concern Present   Feeling of Stress : Not at all  Social Connections: Socially Integrated   Frequency of Communication with Friends and Family: More than three times a  week   Frequency of Social Gatherings with Friends and Family: More than three times a week   Attends Religious Services: More than 4 times per year   Active Member of [employer] or Organizations: Yes   Attends Engineer, structural: More than 4 times per year   Marital Status: Married    Tobacco Counseling Counseling given: Not Answered   Clinical Intake:  Pre-visit preparation completed: Yes  Pain : 0-10 Pain Score: 5  Pain Type: Chronic pain Pain Location: Ankle Pain Descriptors / Indicators: Aching, Sharp, Discomfort Pain Onset: More than a month ago Pain Frequency: Intermittent     BMI - recorded: 27.47 Nutritional Status: BMI 25 -29 Overweight Nutritional Risks: None Diabetes: No  How often do you need to have someone help you when you read instructions, pamphlets, or other written materials from your doctor or pharmacy?: 1 - Never  Diabetic? No  Interpreter Needed?: No  Information entered by :: Candiace West, LPN   Activities of Daily Living In your present state of health, do you have any difficulty performing the following activities: 01/22/2021 12/13/2020  Hearing? N N  Vision? N N  Difficulty concentrating or making decisions? N N  Walking or climbing stairs? N N  Dressing or bathing? N N  Doing errands, shopping? N N  Preparing Food and eating ? N -  Using the Toilet? N -  In the past six months, have you accidently leaked urine? N -  Do you have problems with loss of bowel control? N -  Managing your Medications? N -  Managing your Finances? N -  Housekeeping or managing your Housekeeping? N -  Some recent data might be hidden    Patient Care Team: Bennie Pierini, FNP as PCP - General (Nurse Practitioner)  Indicate any recent Medical Services you may have received from other than [facility] providers in the past year (date may be approximate).     Assessment:   This is a routine wellness examination for Jimmy Bonilla.  Hearing/Vision  screen Hearing Screening - Comments:: C/o mild hearing difficulties - no hearing aids Vision Screening - Comments:: Wears reading glasses otc only - behind on annual eye exams with Dr Dione Booze  Dietary issues and exercise activities discussed: Current Exercise Habits: Home exercise routine, Type of exercise: walking;strength training/weights, Time (Minutes): 45, Frequency (Times/Week): 4, Weekly Exercise (Minutes/Week): 180, Intensity: Moderate, Exercise limited by: None identified   Goals Addressed             This Visit's Progress    DIET - INCREASE WATER INTAKE   On track    Try to drink 6-8 glasses of water daily.       Depression Screen PHQ 2/9 Scores 01/22/2021 12/13/2020 09/20/2019 02/02/2019 01/25/2019 05/31/2018 02/19/2018  PHQ - 2 Score 0 0 0 0 0 0 0  PHQ- 9 Score 0 0 - - - - -  Fall Risk Fall Risk  01/22/2021 12/13/2020 09/20/2019 02/02/2019 01/25/2019  Falls in the past year? 0 0 0 0 1  Number falls in past yr: 0 - - 0 0  Injury with Fall? 0 - - 0 1  Comment - - - - Ankle pain and swelling  Risk for fall due to : No Fall Risks - - - -  Follow up Falls prevention discussed;Education provided - - Falls prevention discussed -  Comment - - - Get rid of all throw rugs in the house, adequate lighting in the walkways and grab bars in the bathroom -    FALL RISK PREVENTION PERTAINING TO THE HOME:  Any stairs in or around the home? Yes  If so, are there any without handrails? No  Home free of loose throw rugs in walkways, pet beds, electrical cords, etc? Yes  Adequate lighting in your home to reduce risk of falls? Yes   ASSISTIVE DEVICES UTILIZED TO PREVENT FALLS:  Life alert? No  Use of a cane, walker or w/c? No  Grab bars in the bathroom? No  Shower chair or bench in shower? No  Elevated toilet seat or a handicapped toilet? No   TIMED UP AND GO:  Was the test performed? No . Telephonic visit  Cognitive Function: Normal cognitive status assessed by direct observation by  this Nurse Health Advisor. No abnormalities found.      6CIT Screen 02/02/2019  What Year? 0 points  What month? 0 points  What time? 0 points  Count back from 20 0 points  Months in reverse 0 points  Repeat phrase 0 points  Total Score 0    Immunizations Immunization History  Administered Date(s) Administered   Fluad Quad(high Dose 65+) 01/25/2019   Influenza,inj,Quad PF,6+ Mos 02/14/2018   Janssen (J&J) SARS-COV-2 Vaccination 07/24/2019   Pneumococcal Conjugate-13 10/29/2017   Pneumococcal Polysaccharide-23 02/06/1997, 01/25/2019   Tdap 08/23/2009    TDAP status: Due, Education has been provided regarding the importance of this vaccine. Advised may receive this vaccine at local pharmacy or Health Dept. Aware to provide a copy of the vaccination record if obtained from local pharmacy or Health Dept. Verbalized acceptance and understanding.  Flu Vaccine status: Due, Education has been provided regarding the importance of this vaccine. Advised may receive this vaccine at local pharmacy or Health Dept. Aware to provide a copy of the vaccination record if obtained from local pharmacy or Health Dept. Verbalized acceptance and understanding.  Pneumococcal vaccine status: Up to date  Covid-19 vaccine status: Completed vaccines  Qualifies for Shingles Vaccine? Yes   Zostavax completed No   Shingrix Completed?: No.    Education has been provided regarding the importance of this vaccine. Patient has been advised to call insurance company to determine out of pocket expense if they have not yet received this vaccine. Advised may also receive vaccine at local pharmacy or Health Dept. Verbalized acceptance and understanding.  Screening Tests Health Maintenance  Topic Date Due   TETANUS/TDAP  08/24/2019   COVID-19 Vaccine (2 - Booster for Janssen series) 09/18/2019   INFLUENZA VACCINE  11/19/2020   Zoster Vaccines- Shingrix (1 of 2) 03/15/2021 (Originally 12/29/2001)   COLONOSCOPY (Pts  45-72yrs Insurance coverage will need to be confirmed)  12/23/2022   Hepatitis C Screening  Completed   HPV VACCINES  Aged Out    Health Maintenance  Health Maintenance Due  Topic Date Due   TETANUS/TDAP  08/24/2019   COVID-19 Vaccine (2 - Booster for [employer]  series) 09/18/2019   INFLUENZA VACCINE  11/19/2020    Colorectal cancer screening: Type of screening: Colonoscopy. Completed 12/22/2012. Repeat every 10 years  Lung Cancer Screening: (Low Dose CT Chest recommended if Age 57-80 years, 30 pack-year currently smoking OR have quit w/in 15years.) does not qualify  Additional Screening:  Hepatitis C Screening: does qualify; Completed 09/24/2015  Vision Screening: Recommended annual ophthalmology exams for early detection of glaucoma and other disorders of the eye. Is the patient up to date with their annual eye exam?  No  Who is the provider or what is the name of the office in which the patient attends annual eye exams? [facility] If pt is not established with a provider, would they like to be referred to a provider to establish care? No .   Dental Screening: Recommended annual dental exams for proper oral hygiene  Community Resource Referral / Chronic Care Management: CRR required this visit?  No   CCM required this visit?  No      Plan:     I have personally reviewed and noted the following in the patient's chart:   Medical and social history Use of alcohol, tobacco or illicit drugs  Current medications and supplements including opioid prescriptions. Patient is not currently taking opioid prescriptions. Functional ability and status Nutritional status Physical activity Advanced directives List of other physicians Hospitalizations, surgeries, and ER visits in previous 12 months Vitals Screenings to include cognitive, depression, and falls Referrals and appointments  In addition, I have reviewed and discussed with patient certain preventive protocols, quality metrics, and  best practice recommendations. A written personalized care plan for preventive services as well as general preventive health recommendations were provided to patient.     [place] Constable, LPN   13/05/[id number]   Nurse Notes: None

## 2021-06-03 ENCOUNTER — Ambulatory Visit: Payer: Medicare HMO | Admitting: Nurse Practitioner

## 2021-06-03 ENCOUNTER — Encounter: Payer: Self-pay | Admitting: Nurse Practitioner

## 2021-06-03 VITALS — BP 134/74 | HR 52 | Temp 98.3°F | Resp 20 | Ht 69.0 in | Wt 184.0 lb

## 2021-06-03 DIAGNOSIS — R972 Elevated prostate specific antigen [PSA]: Secondary | ICD-10-CM

## 2021-06-03 DIAGNOSIS — E7800 Pure hypercholesterolemia, unspecified: Secondary | ICD-10-CM | POA: Diagnosis not present

## 2021-06-03 DIAGNOSIS — I10 Essential (primary) hypertension: Secondary | ICD-10-CM

## 2021-06-03 DIAGNOSIS — N400 Enlarged prostate without lower urinary tract symptoms: Secondary | ICD-10-CM | POA: Diagnosis not present

## 2021-06-03 DIAGNOSIS — Z23 Encounter for immunization: Secondary | ICD-10-CM

## 2021-06-03 DIAGNOSIS — Z6827 Body mass index (BMI) 27.0-27.9, adult: Secondary | ICD-10-CM

## 2021-06-03 DIAGNOSIS — K4090 Unilateral inguinal hernia, without obstruction or gangrene, not specified as recurrent: Secondary | ICD-10-CM

## 2021-06-03 DIAGNOSIS — N529 Male erectile dysfunction, unspecified: Secondary | ICD-10-CM

## 2021-06-03 MED ORDER — ATORVASTATIN CALCIUM 10 MG PO TABS: 10.0000 mg | ORAL_TABLET | Freq: Every day | ORAL | 1 refills | Status: DC

## 2021-06-03 MED ORDER — SILDENAFIL CITRATE 20 MG PO TABS: ORAL_TABLET | ORAL | 2 refills | Status: DC

## 2021-06-03 MED ORDER — TAMSULOSIN HCL 0.4 MG PO CAPS: ORAL_CAPSULE | ORAL | 1 refills | Status: DC

## 2021-06-03 NOTE — Addendum Note (Signed)
Addended by: Cleda Daub on: 06/03/2021 04:34 PM   Modules accepted: Orders

## 2021-06-03 NOTE — Progress Notes (Signed)
Subjective:    Patient ID: Jimmy Bonilla, male    DOB: 1951-01-25, 71 y.o.   MRN: [id number]   Chief Complaint: medical management of chronic issues      HPI:  Jimmy Bonilla is a 71 y.o. who identifies as a male who was assigned male at birth.   Social history: Lives with: wife Work history: retired but still works with [employer] in today for follow up of the following chronic medical issues:  1. Primary hypertension No c/o chest pain, sob or headache. Does not check blood pressure at home. BP Readings from Last 3 Encounters:  12/13/20 124/86  09/20/19 129/80  01/25/19 120/79     2. Pure hypercholesterolemia Does try to watch diet and stays very active. Lab Results  Component Value Date   CHOL 158 09/20/2019   HDL 42 09/20/2019   LDLCALC 94 09/20/2019   TRIG 125 09/20/2019   CHOLHDL 3.8 09/20/2019   The 10-year ASCVD risk score (Arnett DK, et al., 2019) is: 17.4%   3. Elevated prostate specific antigen (PSA) No voiding problems.  Lab Results  Component Value Date   PSA1 10.9 (H) 01/25/2019   PSA1 7.6 (H) 10/29/2017   PSA1 7.7 (H) 09/23/2016   PSA 6.3 (H) 09/19/2013   PSA 6.55 (H) 07/06/2012      4. Benign prostatic hyperplasia without lower urinary tract symptoms Again no voiding problems.  5. ED (erectile dysfunction) of organic origin Is on sildenafil when needed. Works well for him  6. BMI 27.0-27.9,adult No recent weight changes Wt Readings from Last 3 Encounters:  06/03/21 184 lb (83.5 kg)  01/22/21 186 lb (84.4 kg)  12/13/20 186 lb (84.4 kg)   BMI Readings from Last 3 Encounters:  06/03/21 27.17 kg/m  01/22/21 27.47 kg/m  12/13/20 27.47 kg/m     7. Left inguinal hernia No pain   New complaints: none  No Known Allergies Outpatient Encounter Medications as of 06/03/2021  Medication Sig   atorvastatin (LIPITOR) 10 MG tablet Take 1 tablet (10 mg total) by mouth daily.   Multiple Vitamins-Minerals (CENTRUM SILVER  PO) Take 1 tablet by mouth.   naproxen (NAPROSYN) 500 MG tablet TAKE ONE TABLET TWICE A DAY WITH FOOD   sildenafil (REVATIO) 20 MG tablet TAKE ONE (1) TABLET THREE (3) TIMES EACH DAY   tamsulosin (FLOMAX) 0.4 MG CAPS capsule TAKE ONE (1) CAPSULE EACH DAY   No facility-administered encounter medications on file as of 06/03/2021.    Past Surgical History:  Procedure Laterality Date   ANKLE FRACTURE SURGERY Left 1998   COLONOSCOPY W/ POLYPECTOMY     HERNIA REPAIR  2017    Family History  Problem Relation Age of Onset   Diabetes Father    Colon cancer Neg Hx       Controlled substance contract: n/a     Review of Systems  Constitutional:  Negative for diaphoresis.  Eyes:  Negative for pain.  Respiratory:  Negative for shortness of breath.   Cardiovascular:  Negative for chest pain, palpitations and leg swelling.  Gastrointestinal:  Negative for abdominal pain.  Endocrine: Negative for polydipsia.  Skin:  Negative for rash.  Neurological:  Negative for dizziness, weakness and headaches.  Hematological:  Does not bruise/bleed easily.  All other systems reviewed and are negative.     Objective:   Physical Exam Vitals and nursing note reviewed.  Constitutional:      Appearance: Normal appearance. He is well-developed.  HENT:  Head: Normocephalic.     Nose: Nose normal.     Mouth/Throat:     Mouth: Mucous membranes are moist.     Pharynx: Oropharynx is clear.  Eyes:     Pupils: Pupils are equal, round, and reactive to light.  Neck:     Thyroid: No thyroid mass or thyromegaly.     Vascular: No carotid bruit or JVD.     Trachea: Phonation normal.  Cardiovascular:     Rate and Rhythm: Normal rate and regular rhythm.  Pulmonary:     Effort: Pulmonary effort is normal. No respiratory distress.     Breath sounds: Normal breath sounds.  Abdominal:     General: Bowel sounds are normal.     Palpations: Abdomen is soft.     Tenderness: There is no abdominal tenderness.   Musculoskeletal:        General: Normal range of motion.     Cervical back: Normal range of motion and neck supple.  Lymphadenopathy:     Cervical: No cervical adenopathy.  Skin:    General: Skin is warm and dry.  Neurological:     Mental Status: He is alert and oriented to person, place, and time.  Psychiatric:        Behavior: Behavior normal.        Thought Content: Thought content normal.        Judgment: Judgment normal.     BP 134/74    Pulse (!) 52    Temp 98.3 F (36.8 C) (Temporal)    Resp 20    Ht 5' 9"  (1.753 m)    Wt 184 lb (83.5 kg)    SpO2 98%    BMI 27.17 kg/m       Assessment & Plan:   Jimmy Bonilla comes in today with chief complaint of Annual Exam   Diagnosis and orders addressed:  1. Primary hypertension Low sodium diet - CBC with Differential/Platelet - CMP14+EGFR  2. Pure hypercholesterolemia Low fta diet - Lipid panel - atorvastatin (LIPITOR) 10 MG tablet; Take 1 tablet (10 mg total) by mouth daily.  Dispense: 90 tablet; Refill: 1  3. Elevated prostate specific antigen (PSA) Pas pending  4. Benign prostatic hyperplasia without lower urinary tract symptoms - PSA, total and free - tamsulosin (FLOMAX) 0.4 MG CAPS capsule; TAKE ONE (1) CAPSULE EACH DAY  Dispense: 90 capsule; Refill: 1  5. ED (erectile dysfunction) of organic origin - sildenafil (REVATIO) 20 MG tablet; TAKE ONE (1) TABLET THREE (3) TIMES EACH DAY  Dispense: 50 tablet; Refill: 2  6. BMI 27.0-27.9,adult Discussed diet and exercise for person with BMI >25 Will recheck weight in 3-6 months   7. Left inguinal hernia No issues  Labs pending Health Maintenance reviewed Diet and exercise encouraged  Follow up plan: 6 months   [place], FNP

## 2021-06-03 NOTE — Patient Instructions (Signed)
Tdap (Tetanus, Diphtheria, Pertussis) Vaccine: What You Need to Know 1. Why get vaccinated? Tdap vaccine can prevent tetanus, diphtheria, and pertussis. Diphtheria and pertussis spread from person to person. Tetanus enters the body through cuts or wounds. TETANUS (T) causes painful stiffening of the muscles. Tetanus can lead to serious health problems, including being unable to open the mouth, having trouble swallowing and breathing, or death. DIPHTHERIA (D) can lead to difficulty breathing, heart failure, paralysis, or death. PERTUSSIS (aP), also known as "whooping cough," can cause uncontrollable, violent coughing that makes it hard to breathe, eat, or drink. Pertussis can be extremely serious especially in babies and young children, causing pneumonia, convulsions, brain damage, or death. In teens and adults, it can cause weight loss, loss of bladder control, passing out, and rib fractures from severe coughing. 2. Tdap vaccine Tdap is only for children 7 years and older, adolescents, and adults.  Adolescents should receive a single dose of Tdap, preferably at age 11 or 12 years. Pregnant people should get a dose of Tdap during every pregnancy, preferably during the early part of the third trimester, to help protect the newborn from pertussis. Infants are most at risk for severe, life-threatening complications frompertussis. Adults who have never received Tdap should get a dose of Tdap. Also, adults should receive a booster dose of either Tdap or Td (a different vaccine that protects against tetanus and diphtheria but not pertussis) every 10 years, or after 5 years in the case of a severe or dirty wound or burn. Tdap may be given at the same time as other vaccines. 3. Talk with your health care provider Tell your vaccine provider if the person getting the vaccine: Has had an allergic reaction after a previous dose of any vaccine that protects against tetanus, diphtheria, or pertussis, or has any  severe, life-threatening allergies Has had a coma, decreased level of consciousness, or prolonged seizures within 7 days after a previous dose of any pertussis vaccine (DTP, DTaP, or Tdap) Has seizures or another nervous system problem Has ever had Guillain-Barr Syndrome (also called "GBS") Has had severe pain or swelling after a previous dose of any vaccine that protects against tetanus or diphtheria In some cases, your health care provider may decide to postpone Tdapvaccination until a future visit. People with minor illnesses, such as a cold, may be vaccinated. People who are moderately or severely ill should usually wait until they recover beforegetting Tdap vaccine.  Your health care provider can give you more information. 4. Risks of a vaccine reaction Pain, redness, or swelling where the shot was given, mild fever, headache, feeling tired, and nausea, vomiting, diarrhea, or stomachache sometimes happen after Tdap vaccination. People sometimes faint after medical procedures, including vaccination. Tellyour provider if you feel dizzy or have vision changes or ringing in the ears.  As with any medicine, there is a very remote chance of a vaccine causing asevere allergic reaction, other serious injury, or death. 5. What if there is a serious problem? An allergic reaction could occur after the vaccinated person leaves the clinic. If you see signs of a severe allergic reaction (hives, swelling of the face and throat, difficulty breathing, a fast heartbeat, dizziness, or weakness), call 9-1-1and get the person to the nearest hospital. For other signs that concern you, call your health care provider.  Adverse reactions should be reported to the Vaccine Adverse Event Reporting System (VAERS). Your health care provider will usually file this report, or you can do it yourself. Visit the   VAERS website at www.vaers.hhs.gov or call 1-800-822-7967. VAERS is only for reporting reactions, and VAERS staff  members do not give medical advice. 6. The National Vaccine Injury Compensation Program The National Vaccine Injury Compensation Program (VICP) is a federal program that was created to compensate people who may have been injured by certain vaccines. Claims regarding alleged injury or death due to vaccination have a time limit for filing, which may be as short as two years. Visit the VICP website at www.hrsa.gov/vaccinecompensation or call 1-800-338-2382to learn about the program and about filing a claim. 7. How can I learn more? Ask your health care provider. Call your local or state health department. Visit the website of the Food and Drug Administration (FDA) for vaccine package inserts and additional information at www.fda.gov/vaccines-blood-biologics/vaccines. Contact the Centers for Disease Control and Prevention (CDC): Call 1-800-232-4636 (1-800-CDC-INFO) or Visit CDC's website at www.cdc.gov/vaccines. Vaccine Information Statement Tdap (Tetanus, Diphtheria, Pertussis) Vaccine(11/25/2019) This information is not intended to replace advice given to you by your health care provider. Make sure you discuss any questions you have with your healthcare provider. Document Revised: 12/21/2019 Document Reviewed: 12/21/2019 Elsevier Patient Education  2022 Elsevier Inc.  

## 2021-06-04 LAB — LIPID PANEL
Chol/HDL Ratio: 4.1 ratio (ref 0.0–5.0)
Cholesterol, Total: 169 mg/dL (ref 100–199)
HDL: 41 mg/dL (ref >39)
LDL Chol Calc (NIH): 106 mg/dL — ABNORMAL HIGH (ref 0–99)
Triglycerides: 123 mg/dL (ref 0–149)
VLDL Cholesterol Cal: 22 mg/dL (ref 5–40)

## 2021-06-04 LAB — CBC WITH DIFFERENTIAL/PLATELET
Basophils Absolute: 0 10*3/uL (ref 0.0–0.2)
Basos: 1 %
EOS (ABSOLUTE): 0.1 10*3/uL (ref 0.0–0.4)
Eos: 2 %
Hematocrit: 45.4 % (ref 37.5–51.0)
Hemoglobin: 15.4 g/dL (ref 13.0–17.7)
Immature Grans (Abs): 0 10*3/uL (ref 0.0–0.1)
Immature Granulocytes: 0 %
Lymphocytes Absolute: 1.6 10*3/uL (ref 0.7–3.1)
Lymphs: 28 %
MCH: 31.9 pg (ref 26.6–33.0)
MCHC: 33.9 g/dL (ref 31.5–35.7)
MCV: 94 fL (ref 79–97)
Monocytes Absolute: 0.7 10*3/uL (ref 0.1–0.9)
Monocytes: 12 %
Neutrophils Absolute: 3.2 10*3/uL (ref 1.4–7.0)
Neutrophils: 57 %
Platelets: 289 10*3/uL (ref 150–450)
RBC: 4.83 x10E6/uL (ref 4.14–5.80)
RDW: 12 % (ref 11.6–15.4)
WBC: 5.7 10*3/uL (ref 3.4–10.8)

## 2021-06-04 LAB — CMP14+EGFR
ALT: 15 IU/L (ref 0–44)
AST: 21 IU/L (ref 0–40)
Albumin/Globulin Ratio: 1.8 (ref 1.2–2.2)
Albumin: 4.6 g/dL (ref 3.8–4.8)
Alkaline Phosphatase: 120 IU/L (ref 44–121)
BUN/Creatinine Ratio: 12 (ref 10–24)
BUN: 10 mg/dL (ref 8–27)
Bilirubin Total: 0.5 mg/dL (ref 0.0–1.2)
CO2: 25 mmol/L (ref 20–29)
Calcium: 9.6 mg/dL (ref 8.6–10.2)
Chloride: 102 mmol/L (ref 96–106)
Creatinine, Ser: 0.85 mg/dL (ref 0.76–1.27)
Globulin, Total: 2.6 g/dL (ref 1.5–4.5)
Glucose: 97 mg/dL (ref 70–99)
Potassium: 4.2 mmol/L (ref 3.5–5.2)
Sodium: 143 mmol/L (ref 134–144)
Total Protein: 7.2 g/dL (ref 6.0–8.5)
eGFR: 94 mL/min/{1.73_m2} (ref >59)

## 2021-06-04 LAB — PSA, TOTAL AND FREE
PSA, Free Pct: 19.4 %
PSA, Free: 2.68 ng/mL
Prostate Specific Ag, Serum: 13.8 ng/mL — ABNORMAL HIGH (ref 0.0–4.0)

## 2021-10-01 ENCOUNTER — Other Ambulatory Visit: Payer: Self-pay | Admitting: Urology

## 2021-10-01 DIAGNOSIS — R972 Elevated prostate specific antigen [PSA]: Secondary | ICD-10-CM

## 2021-10-03 ENCOUNTER — Other Ambulatory Visit (HOSPITAL_COMMUNITY): Payer: Self-pay | Admitting: Urology

## 2021-10-03 DIAGNOSIS — R972 Elevated prostate specific antigen [PSA]: Secondary | ICD-10-CM

## 2021-10-18 ENCOUNTER — Ambulatory Visit
Admission: RE | Admit: 2021-10-18 | Discharge: 2021-10-18 | Disposition: A | Discharge status: Home / Self Care | Payer: Medicare HMO | Source: Ambulatory Visit | Attending: Urology | Admitting: Urology

## 2021-10-18 DIAGNOSIS — R972 Elevated prostate specific antigen [PSA]: Secondary | ICD-10-CM

## 2021-10-18 MED ORDER — GADOBENATE DIMEGLUMINE 529 MG/ML IV SOLN
17.0000 mL | Freq: Once | INTRAVENOUS | Status: AC | PRN
  Administered 2021-10-18: 17 mL via INTRAVENOUS

## 2021-11-28 ENCOUNTER — Telehealth: Payer: Self-pay | Admitting: Nurse Practitioner

## 2021-12-02 ENCOUNTER — Ambulatory Visit: Payer: Medicare HMO | Admitting: Nurse Practitioner

## 2021-12-16 ENCOUNTER — Telehealth: Payer: Self-pay | Admitting: Nurse Practitioner

## 2021-12-16 DIAGNOSIS — E7800 Pure hypercholesterolemia, unspecified: Secondary | ICD-10-CM

## 2021-12-16 MED ORDER — ATORVASTATIN CALCIUM 10 MG PO TABS: 10.0000 mg | ORAL_TABLET | ORAL | 1 refills | Status: DC

## 2021-12-16 NOTE — Telephone Encounter (Signed)
Presciption corrected

## 2021-12-30 ENCOUNTER — Encounter: Payer: Self-pay | Admitting: Nurse Practitioner

## 2021-12-30 ENCOUNTER — Ambulatory Visit (INDEPENDENT_AMBULATORY_CARE_PROVIDER_SITE_OTHER): Payer: Medicare HMO

## 2021-12-30 ENCOUNTER — Telehealth: Payer: Self-pay | Admitting: Nurse Practitioner

## 2021-12-30 ENCOUNTER — Ambulatory Visit (INDEPENDENT_AMBULATORY_CARE_PROVIDER_SITE_OTHER): Payer: Medicare HMO | Admitting: Nurse Practitioner

## 2021-12-30 VITALS — BP 135/80 | HR 57 | Temp 98.6°F | Ht 69.0 in | Wt 189.0 lb

## 2021-12-30 DIAGNOSIS — M25572 Pain in left ankle and joints of left foot: Secondary | ICD-10-CM

## 2021-12-30 MED ORDER — METHYLPREDNISOLONE ACETATE 40 MG/ML IJ SUSP
40.0000 mg | Freq: Once | INTRAMUSCULAR | Status: AC
  Administered 2021-12-30: 40 mg via INTRAMUSCULAR

## 2021-12-30 MED ORDER — IBUPROFEN 600 MG PO TABS: 600.0000 mg | ORAL_TABLET | Freq: Three times a day (TID) | ORAL | 0 refills | Status: AC | PRN

## 2021-12-30 NOTE — Patient Instructions (Signed)
Ankle Pain The ankle joint holds your body weight and allows you to move around. Ankle pain can occur on either side or the back of one ankle or both ankles. Ankle pain may be sharp and burning or dull and aching. There may be tenderness, stiffness, redness, or warmth around the ankle. Many things can cause ankle pain, including an injury to the area and overuse of the ankle. Follow these instructions at home: Activity Rest your ankle as told by your health care provider. Avoid any activities that cause ankle pain. Do not use the injured limb to support your body weight until your health care provider says that you can. Use crutches as told by your health care provider. Do exercises as told by your health care provider. Ask your health care provider when it is safe to drive if you have a brace on your ankle. If you have a brace: Wear the brace as told by your health care provider. Remove it only as told by your health care provider. Loosen the brace if your toes tingle, become numb, or turn cold and blue. Keep the brace clean. If the brace is not waterproof: Do not let it get wet. Cover it with a watertight covering when you take a bath or shower. If you were given an elastic bandage:  Remove it when you take a bath or a shower. Try not to move your ankle very much, but wiggle your toes from time to time. This helps to prevent swelling. Adjust the bandage to make it more comfortable if it feels too tight. Loosen the bandage if you have numbness or tingling in your foot or if your foot turns cold and blue. Managing pain, stiffness, and swelling  If directed, put ice on the painful area. If you have a removable brace or elastic bandage, remove it as told by your health care provider. Put ice in a plastic bag. Place a towel between your skin and the bag. Leave the ice on for 20 minutes, 2-3 times a day. Move your toes often to avoid stiffness and to lessen swelling. Raise (elevate) your  ankle above the level of your heart while you are sitting or lying down. General instructions Record information about your pain. Writing down the following may be helpful for you and your health care provider: How often you have ankle pain. Where the pain is located. What the pain feels like. If treatment involves wearing a prescribed shoe or insole, make sure you wear it correctly and for as long as told by your health care provider. Take over-the-counter and prescription medicines only as told by your health care provider. Keep all follow-up visits as told by your health care provider. This is important. Contact a health care provider if: Your pain gets worse. Your pain is not relieved with medicines. You have a fever or chills. You are having more trouble with walking. You have new symptoms. Get help right away if: Your foot, leg, toes, or ankle: Tingles or becomes numb. Becomes swollen. Turns pale or blue. Summary Ankle pain can occur on either side or the back of one ankle or both ankles. Ankle pain may be sharp and burning or dull and aching. Rest your ankle as told by your health care provider. If told, apply ice to the area. Take over-the-counter and prescription medicines only as told by your health care provider. This information is not intended to replace advice given to you by your health care provider. Make sure you discuss   any questions you have with your health care provider. Document Revised: 05/31/2020 Document Reviewed: 05/31/2020 Elsevier Patient Education  2023 Elsevier Inc.  

## 2021-12-30 NOTE — Progress Notes (Signed)
Acute Office Visit  Subjective:     Patient ID: Sonya Pucci, male    DOB: 20-Jun-1950, 71 y.o.   MRN: [id number]  Chief Complaint  Patient presents with   Ankle Pain    Left ankle pain - jumped in a pool Monday and jammed his foot on the bottom of the pool     Ankle Pain  The incident occurred 3 to 5 days ago. The incident occurred at the pool. The injury mechanism was a fall. The pain is present in the left ankle. The pain is at a severity of 4/10. The pain is moderate. The pain has been Intermittent since onset. Pertinent negatives include no loss of motion, loss of sensation or tingling. He reports no foreign bodies present. He has tried NSAIDs for the symptoms. The treatment provided moderate relief.     Review of Systems  Constitutional: Negative.  Negative for chills and fever.  HENT: Negative.    Eyes: Negative.   Respiratory: Negative.    Cardiovascular: Negative.   Musculoskeletal:  Positive for joint pain.  Skin: Negative.  Negative for rash.  Neurological:  Negative for tingling.  All other systems reviewed and are negative.       Objective:    BP 135/80   Pulse (!) 57   Temp 98.6 F (37 C)   Ht 5\' 9"  (1.753 m)   Wt 189 lb (85.7 kg)   SpO2 98%   BMI 27.91 kg/m  BP Readings from Last 3 Encounters:  12/30/21 135/80  06/03/21 134/74  12/13/20 124/86   Wt Readings from Last 3 Encounters:  12/30/21 189 lb (85.7 kg)  06/03/21 184 lb (83.5 kg)  01/22/21 186 lb (84.4 kg)      Physical Exam Vitals and nursing note reviewed.  Constitutional:      Appearance: Normal appearance.  HENT:     Head: Normocephalic.     Right Ear: External ear normal.     Left Ear: External ear normal.     Nose: Nose normal.  Eyes:     Conjunctiva/sclera: Conjunctivae normal.  Cardiovascular:     Rate and Rhythm: Normal rate and regular rhythm.     Pulses: Normal pulses.     Heart sounds: Normal heart sounds.  Pulmonary:     Effort: Pulmonary effort is normal.      Breath sounds: Normal breath sounds.  Abdominal:     General: Bowel sounds are normal.  Musculoskeletal:        General: Swelling, tenderness and signs of injury present.     Left ankle: Swelling present. Tenderness present. No ATF ligament or AITF ligament tenderness. Decreased range of motion. Normal pulse.     Left Achilles Tendon: Tenderness present.     Comments: left ankle pain  Skin:    Findings: No erythema or rash.  Neurological:     General: No focal deficit present.     Mental Status: He is alert and oriented to person, place, and time.  Psychiatric:        Mood and Affect: Mood normal.        Behavior: Behavior normal.     No results found for any visits on 12/30/21.      Assessment & Plan:  Left ankle pain symptoms not well resolved in the past 3 to 4 days.  Patient jumped into the pool and injured left ankle that was shattered over 30 years ago.  Completed x-ray results pending. 40 Depo-Medrol shot given in  clinic for pain and inflammation.  Continue ice, elevate feet.  Ibuprofen 600 mg tablet by mouth as needed.  Patient knows not to take ibuprofen and naproxen at the same time.  Referral to orthopedic (patient prefers to call previous orthopedic at [facility] Dr. Zollie Beckers.  Advised patient to call Dr. Ola Spurr and if not to give [place] a call to complete referral.  Patient verbalized understanding.  Education provided printed handouts given.   Follow-up with unresolved symptoms.   Problem List Items Addressed This Visit   None Visit Diagnoses     Acute left ankle pain    -  Primary   Relevant Medications   ibuprofen (ADVIL) 600 MG tablet   methylPREDNISolone acetate (DEPO-MEDROL) injection 40 mg (Completed) (Start on 12/30/2021 10:00 AM)   Other Relevant Orders   DG Ankle Complete Left       Meds ordered this encounter  Medications   ibuprofen (ADVIL) 600 MG tablet    Sig: Take 1 tablet (600 mg total) by mouth every 8 (eight) hours as needed.     Dispense:  30 tablet    Refill:  0    Order Specific Question:   Supervising Provider    Answer:   Mechele Claude [[id number]]   methylPREDNISolone acetate (DEPO-MEDROL) injection 40 mg    Return if symptoms worsen or fail to improve.  Daryll Drown, NP

## 2022-01-09 NOTE — Telephone Encounter (Signed)
These was faxed last week date of appt

## 2022-02-06 ENCOUNTER — Other Ambulatory Visit: Payer: Self-pay | Admitting: Nurse Practitioner

## 2022-02-06 DIAGNOSIS — N400 Enlarged prostate without lower urinary tract symptoms: Secondary | ICD-10-CM

## 2022-04-10 ENCOUNTER — Telehealth: Payer: Self-pay | Admitting: Nurse Practitioner

## 2022-04-10 NOTE — Telephone Encounter (Signed)
Left message for patient to call back and schedule Medicare Annual Wellness Visit (AWV) to be completed by video or phone.   Last AWV: 01/22/2021   Please schedule at anytime with [facility] Nurse Health Advisor     45 minute appointment  Any questions, please contact me at [phone]   Thank you,   [facility] Ambulatory Clinical Support for [facility]  Care Management [facility] You Are. We Are. [facility] ??[phone] or ??[phone]

## 2022-04-11 ENCOUNTER — Ambulatory Visit (INDEPENDENT_AMBULATORY_CARE_PROVIDER_SITE_OTHER): Payer: Medicare HMO

## 2022-04-11 VITALS — Ht 70.0 in | Wt 189.0 lb

## 2022-04-11 DIAGNOSIS — Z0000 Encounter for general adult medical examination without abnormal findings: Secondary | ICD-10-CM

## 2022-04-11 NOTE — Progress Notes (Signed)
Subjective:   Jimmy Bonilla is a 71 y.o. male who presents for Medicare Annual/Subsequent preventive examination. I connected with  Jimmy Bonilla on 04/11/22 by a audio enabled telemedicine application and verified that I am speaking with the correct person using two identifiers.  Patient Location: Home  Provider Location: Home Office  I discussed the limitations of evaluation and management by telemedicine. The patient expressed understanding and agreed to proceed.  Review of Systems     Cardiac Risk Factors include: advanced age (>6355men, 55>65 women);hypertension;male gender     Objective:    Today's Vitals   04/11/22 1204  Weight: 189 lb (85.7 kg)  Height: 5\' 10"  (1.778 m)   Body mass index is 27.12 kg/m.     04/11/2022   12:07 PM 01/22/2021    4:13 PM 02/02/2019    8:40 AM  Advanced Directives  Does Patient Have a Medical Advance Directive? Yes Yes No  Type of Estate agentAdvance Directive Healthcare Power of [place];Living will Healthcare Power of [place]orney;Living will   Copy of Healthcare Power of Attorney in Chart? No - copy requested No - copy requested   Would patient like information on creating a medical advance directive?   No - Patient declined    Current Medications (verified) Outpatient Encounter Medications as of 04/11/2022  Medication Sig   atorvastatin (LIPITOR) 10 MG tablet Take 1 tablet (10 mg total) by mouth every other day.   ibuprofen (ADVIL) 600 MG tablet Take 1 tablet (600 mg total) by mouth every 8 (eight) hours as needed.   Multiple Vitamins-Minerals (CENTRUM SILVER PO) Take 1 tablet by mouth.   sildenafil (REVATIO) 20 MG tablet TAKE ONE (1) TABLET THREE (3) TIMES EACH DAY   tamsulosin (FLOMAX) 0.4 MG CAPS capsule TAKE ONE (1) CAPSULE EACH DAY (NEEDS TO BE SEEN BEFORE NEXT REFILL)   No facility-administered encounter medications on file as of 04/11/2022.    Allergies (verified) Patient has no known allergies.   History: Past Medical History:   Diagnosis Date   Enlarged prostate    Erectile dysfunction    Hyperlipidemia    Past Surgical History:  Procedure Laterality Date   ANKLE FRACTURE SURGERY Left 1998   COLONOSCOPY W/ POLYPECTOMY     HERNIA REPAIR  2017   Family History  Problem Relation Age of Onset   Diabetes Father    Colon cancer Neg Hx    Social History   Socioeconomic History   Marital status: Married    Spouse name: Jimmy SquibbJane   Number of children: 2   Years of education: 14   Highest education level: Associate degree: academic program  Occupational History   Occupation: retired  Tobacco Use   Smoking status: Never   Smokeless tobacco: Never  Vaping Use   Vaping Use: Never used  Substance and Sexual Activity   Alcohol use: No   Drug use: No   Sexual activity: Not Currently  Other Topics Concern   Not on file  Social History Narrative   Not on file   Social Determinants of Health   Financial Resource Strain: Low Risk  (04/11/2022)   Overall Financial Resource Strain (CARDIA)    Difficulty of Paying Living Expenses: Not hard at all  Food Insecurity: No Food Insecurity (04/11/2022)   Hunger Vital Sign    Worried About Running Out of Food in the Last Year: Never true    Ran Out of Food in the Last Year: Never true  Transportation Needs: No Transportation Needs (04/11/2022)  PRAPARE - Administrator, Civil Service (Medical): No    Lack of Transportation (Non-Medical): No  Physical Activity: Sufficiently Active (04/11/2022)   Exercise Vital Sign    Days of Exercise per Week: 5 days    Minutes of Exercise per Session: 30 min  Stress: No Stress Concern Present (04/11/2022)   [employer] of Occupational Health - Occupational Stress Questionnaire    Feeling of Stress : Not at all  Social Connections: Socially Integrated (04/11/2022)   Social Connection and Isolation Panel [NHANES]    Frequency of Communication with Friends and Family: More than three times a week    Frequency of  Social Gatherings with Friends and Family: More than three times a week    Attends Religious Services: More than 4 times per year    Active Member of [employer] or Organizations: Yes    Attends Engineer, structural: More than 4 times per year    Marital Status: Married    Tobacco Counseling Counseling given: Not Answered   Clinical Intake:  Pre-visit preparation completed: Yes  Pain : No/denies pain     Nutritional Risks: None Diabetes: No  How often do you need to have someone help you when you read instructions, pamphlets, or other written materials from your doctor or pharmacy?: 1 - Never  Diabetic?no   Interpreter Needed?: No  Information entered by :: Renie Ora, LPN   Activities of Daily Living    04/11/2022   12:07 PM  In your present state of health, do you have any difficulty performing the following activities:  Hearing? 0  Vision? 0  Difficulty concentrating or making decisions? 0  Walking or climbing stairs? 0  Dressing or bathing? 0  Doing errands, shopping? 0  Preparing Food and eating ? N  Using the Toilet? N  In the past six months, have you accidently leaked urine? N  Do you have problems with loss of bowel control? N  Managing your Medications? N  Managing your Finances? N  Housekeeping or managing your Housekeeping? N    Patient Care Team: Bennie Pierini, FNP as PCP - General (Nurse Practitioner)  Indicate any recent Medical Services you may have received from other than [facility] providers in the past year (date may be approximate).     Assessment:   This is a routine wellness examination for Jimmy Bonilla.  Hearing/Vision screen Vision Screening - Comments:: Wears rx glasses - up to date with routine eye exams with  Dr.Groat   Dietary issues and exercise activities discussed: Current Exercise Habits: Home exercise routine, Type of exercise: walking;strength training/weights, Time (Minutes): 30, Frequency (Times/Week): 5, Weekly  Exercise (Minutes/Week): 150, Intensity: Mild, Exercise limited by: None identified   Goals Addressed             This Visit's Progress    DIET - INCREASE WATER INTAKE   On track    Try to drink 6-8 glasses of water daily.       Depression Screen    04/11/2022   12:06 PM 12/30/2021    9:13 AM 06/03/2021    2:56 PM 01/22/2021    4:12 PM 12/13/2020    1:57 PM 09/20/2019    2:51 PM 02/02/2019    8:41 AM  PHQ 2/9 Scores  PHQ - 2 Score 0 0 0 0 0 0 0  PHQ- 9 Score  0 0 0 0      Fall Risk    04/11/2022   12:05 PM 06/03/2021  2:56 PM 01/22/2021    4:14 PM 12/13/2020    1:57 PM 09/20/2019    2:51 PM  Fall Risk   Falls in the past year? 0 0 0 0 0  Number falls in past yr: 0  0    Injury with Fall? 0  0    Risk for fall due to : No Fall Risks  No Fall Risks    Follow up Falls prevention discussed  Falls prevention discussed;Education provided      FALL RISK PREVENTION PERTAINING TO THE HOME:  Any stairs in or around the home? Yes  If so, are there any without handrails? No  Home free of loose throw rugs in walkways, pet beds, electrical cords, etc? Yes  Adequate lighting in your home to reduce risk of falls? Yes   ASSISTIVE DEVICES UTILIZED TO PREVENT FALLS:  Life alert? No  Use of a cane, walker or w/c? No  Grab bars in the bathroom? No  Shower chair or bench in shower? No  Elevated toilet seat or a handicapped toilet? No          04/11/2022   12:07 PM 02/02/2019    8:43 AM  6CIT Screen  What Year? 0 points 0 points  What month? 0 points 0 points  What time? 0 points 0 points  Count back from 20 0 points 0 points  Months in reverse 0 points 0 points  Repeat phrase 0 points 0 points  Total Score 0 points 0 points    Immunizations Immunization History  Administered Date(s) Administered   Fluad Quad(high Dose 65+) 01/25/2019   Influenza,inj,Quad PF,6+ Mos 02/14/2018   Janssen (J&J) SARS-COV-2 Vaccination 07/24/2019   Pneumococcal Conjugate-13 10/29/2017    Pneumococcal Polysaccharide-23 02/06/1997, 01/25/2019   Tdap 08/23/2009, 06/03/2021    TDAP status: Up to date  Flu Vaccine status: Declined, Education has been provided regarding the importance of this vaccine but patient still declined. Advised may receive this vaccine at local pharmacy or Health Dept. Aware to provide a copy of the vaccination record if obtained from local pharmacy or Health Dept. Verbalized acceptance and understanding.  Pneumococcal vaccine status: Up to date  Covid-19 vaccine status: Completed vaccines  Qualifies for Shingles Vaccine? Yes   Zostavax completed Yes   Shingrix Completed?: Yes  Screening Tests Health Maintenance  Topic Date Due   Zoster Vaccines- Shingrix (1 of 2) Never done   INFLUENZA VACCINE  11/19/2021   COVID-19 Vaccine (2 - 2023-24 season) 12/20/2021   COLONOSCOPY (Pts 45-32yrs Insurance coverage will need to be confirmed)  12/23/2022   Medicare Annual Wellness (AWV)  04/12/2023   DTaP/Tdap/Td (3 - Td or Tdap) 06/04/2031   Pneumonia Vaccine 73+ Years old  Completed   Hepatitis C Screening  Completed   HPV VACCINES  Aged Out    Health Maintenance  Health Maintenance Due  Topic Date Due   Zoster Vaccines- Shingrix (1 of 2) Never done   INFLUENZA VACCINE  11/19/2021   COVID-19 Vaccine (2 - 2023-24 season) 12/20/2021    Colorectal cancer screening: Type of screening: Colonoscopy. Completed 12/22/2012. Repeat every 10 years  Lung Cancer Screening: (Low Dose CT Chest recommended if Age 10-80 years, 30 pack-year currently smoking OR have quit w/in 15years.) does not qualify.   Lung Cancer Screening Referral: n/a  Additional Screening:  Hepatitis C Screening: does not qualify;   Vision Screening: Recommended annual ophthalmology exams for early detection of glaucoma and other disorders of the eye. Is the patient  up to date with their annual eye exam?  Yes  Who is the provider or what is the name of the office in which the patient  attends annual eye exams? Dr.groat  If pt is not established with a provider, would they like to be referred to a provider to establish care? No .   Dental Screening: Recommended annual dental exams for proper oral hygiene  Community Resource Referral / Chronic Care Management: CRR required this visit?  No   CCM required this visit?  No      Plan:     I have personally reviewed and noted the following in the patient's chart:   Medical and social history Use of alcohol, tobacco or illicit drugs  Current medications and supplements including opioid prescriptions. Patient is not currently taking opioid prescriptions. Functional ability and status Nutritional status Physical activity Advanced directives List of other physicians Hospitalizations, surgeries, and ER visits in previous 12 months Vitals Screenings to include cognitive, depression, and falls Referrals and appointments  In addition, I have reviewed and discussed with patient certain preventive protocols, quality metrics, and best practice recommendations. A written personalized care plan for preventive services as well as general preventive health recommendations were provided to patient.     Lorrene Reid, LPN   [id number]   Nurse Notes: none

## 2022-04-11 NOTE — Patient Instructions (Signed)
Jimmy Bonilla , Thank you for taking time to come for your Medicare Wellness Visit. I appreciate your ongoing commitment to your health goals. Please review the following plan we discussed and let me know if I can assist you in the future.   These are the goals we discussed:  Goals      DIET - INCREASE WATER INTAKE     Try to drink 6-8 glasses of water daily.        This is a list of the screening recommended for you and due dates:  Health Maintenance  Topic Date Due   Zoster (Shingles) Vaccine (1 of 2) Never done   Flu Shot  11/19/2021   COVID-19 Vaccine (2 - 2023-24 season) 12/20/2021   Colon Cancer Screening  12/23/2022   Medicare Annual Wellness Visit  04/12/2023   DTaP/Tdap/Td vaccine (3 - Td or Tdap) 06/04/2031   Pneumonia Vaccine  Completed   Hepatitis C Screening: USPSTF Recommendation to screen - Ages 3-79 yo.  Completed   HPV Vaccine  Aged Out    Advanced directives: Advance directive discussed with you today. I have provided a copy for you to complete at home and have notarized. Once this is complete please bring a copy in to our office so we can scan it into your chart.   Conditions/risks identified: Aim for 30 minutes of exercise or brisk walking, 6-8 glasses of water, and 5 servings of fruits and vegetables each day.   Next appointment: Follow up in one year for your annual wellness visit.   Preventive Care 61 Years and Older, Male  Preventive care refers to lifestyle choices and visits with your health care provider that can promote health and wellness. What does preventive care include? A yearly physical exam. This is also called an annual well check. Dental exams once or twice a year. Routine eye exams. Ask your health care provider how often you should have your eyes checked. Personal lifestyle choices, including: Daily care of your teeth and gums. Regular physical activity. Eating a healthy diet. Avoiding tobacco and drug use. Limiting alcohol  use. Practicing safe sex. Taking low doses of aspirin every day. Taking vitamin and mineral supplements as recommended by your health care provider. What happens during an annual well check? The services and screenings done by your health care provider during your annual well check will depend on your age, overall health, lifestyle risk factors, and family history of disease. Counseling  Your health care provider may ask you questions about your: Alcohol use. Tobacco use. Drug use. Emotional well-being. Home and relationship well-being. Sexual activity. Eating habits. History of falls. Memory and ability to understand (cognition). Work and work Astronomer. Screening  You may have the following tests or measurements: Height, weight, and BMI. Blood pressure. Lipid and cholesterol levels. These may be checked every 5 years, or more frequently if you are over 56 years old. Skin check. Lung cancer screening. You may have this screening every year starting at age 24 if you have a 30-pack-year history of smoking and currently smoke or have quit within the past 15 years. Fecal occult blood test (FOBT) of the stool. You may have this test every year starting at age 16. Flexible sigmoidoscopy or colonoscopy. You may have a sigmoidoscopy every 5 years or a colonoscopy every 10 years starting at age 18. Prostate cancer screening. Recommendations will vary depending on your family history and other risks. Hepatitis C blood test. Hepatitis B blood test. Sexually transmitted disease (STD)  testing. Diabetes screening. This is done by checking your blood sugar (glucose) after you have not eaten for a while (fasting). You may have this done every 1-3 years. Abdominal aortic aneurysm (AAA) screening. You may need this if you are a current or former smoker. Osteoporosis. You may be screened starting at age 79 if you are at high risk. Talk with your health care provider about your test results,  treatment options, and if necessary, the need for more tests. Vaccines  Your health care provider may recommend certain vaccines, such as: Influenza vaccine. This is recommended every year. Tetanus, diphtheria, and acellular pertussis (Tdap, Td) vaccine. You may need a Td booster every 10 years. Zoster vaccine. You may need this after age 17. Pneumococcal 13-valent conjugate (PCV13) vaccine. One dose is recommended after age 42. Pneumococcal polysaccharide (PPSV23) vaccine. One dose is recommended after age 64. Talk to your health care provider about which screenings and vaccines you need and how often you need them. This information is not intended to replace advice given to you by your health care provider. Make sure you discuss any questions you have with your health care provider. Document Released: 05/04/2015 Document Revised: 12/26/2015 Document Reviewed: 02/06/2015 Elsevier Interactive Patient Education  2017 [place] Prevention in the Home Falls can cause injuries. They can happen to people of all ages. There are many things you can do to make your home safe and to help prevent falls. What can I do on the outside of my home? Regularly fix the edges of walkways and driveways and fix any cracks. Remove anything that might make you trip as you walk through a door, such as a raised step or threshold. Trim any bushes or trees on the path to your home. Use bright outdoor lighting. Clear any walking paths of anything that might make someone trip, such as rocks or tools. Regularly check to see if handrails are loose or broken. Make sure that both sides of any steps have handrails. Any raised decks and porches should have guardrails on the edges. Have any leaves, snow, or ice cleared regularly. Use sand or salt on walking paths during winter. Clean up any spills in your garage right away. This includes oil or grease spills. What can I do in the bathroom? Use night  lights. Install grab bars by the toilet and in the tub and shower. Do not use towel bars as grab bars. Use non-skid mats or decals in the tub or shower. If you need to sit down in the shower, use a plastic, non-slip stool. Keep the floor dry. Clean up any water that spills on the floor as soon as it happens. Remove soap buildup in the tub or shower regularly. Attach bath mats securely with double-sided non-slip rug tape. Do not have throw rugs and other things on the floor that can make you trip. What can I do in the bedroom? Use night lights. Make sure that you have a light by your bed that is easy to reach. Do not use any sheets or blankets that are too big for your bed. They should not hang down onto the floor. Have a firm chair that has side arms. You can use this for support while you get dressed. Do not have throw rugs and other things on the floor that can make you trip. What can I do in the kitchen? Clean up any spills right away. Avoid walking on wet floors. Keep items that you use a lot in  easy-to-reach places. If you need to reach something above you, use a strong step stool that has a grab bar. Keep electrical cords out of the way. Do not use floor polish or wax that makes floors slippery. If you must use wax, use non-skid floor wax. Do not have throw rugs and other things on the floor that can make you trip. What can I do with my stairs? Do not leave any items on the stairs. Make sure that there are handrails on both sides of the stairs and use them. Fix handrails that are broken or loose. Make sure that handrails are as long as the stairways. Check any carpeting to make sure that it is firmly attached to the stairs. Fix any carpet that is loose or worn. Avoid having throw rugs at the top or bottom of the stairs. If you do have throw rugs, attach them to the floor with carpet tape. Make sure that you have a light switch at the top of the stairs and the bottom of the stairs. If  you do not have them, ask someone to add them for you. What else can I do to help prevent falls? Wear shoes that: Do not have high heels. Have rubber bottoms. Are comfortable and fit you well. Are closed at the toe. Do not wear sandals. If you use a stepladder: Make sure that it is fully opened. Do not climb a closed stepladder. Make sure that both sides of the stepladder are locked into place. Ask someone to hold it for you, if possible. Clearly mark and make sure that you can see: Any grab bars or handrails. First and last steps. Where the edge of each step is. Use tools that help you move around (mobility aids) if they are needed. These include: Canes. Walkers. Scooters. Crutches. Turn on the lights when you go into a dark area. Replace any light bulbs as soon as they burn out. Set up your furniture so you have a clear path. Avoid moving your furniture around. If any of your floors are uneven, fix them. If there are any pets around you, be aware of where they are. Review your medicines with your doctor. Some medicines can make you feel dizzy. This can increase your chance of falling. Ask your doctor what other things that you can do to help prevent falls. This information is not intended to replace advice given to you by your health care provider. Make sure you discuss any questions you have with your health care provider. Document Released: 02/01/2009 Document Revised: 09/13/2015 Document Reviewed: 05/12/2014 Elsevier Interactive Patient Education  2017 [employer].

## 2022-05-20 ENCOUNTER — Other Ambulatory Visit: Payer: Self-pay | Admitting: Nurse Practitioner

## 2022-05-20 DIAGNOSIS — N400 Enlarged prostate without lower urinary tract symptoms: Secondary | ICD-10-CM

## 2022-07-03 ENCOUNTER — Other Ambulatory Visit: Payer: Self-pay | Admitting: Nurse Practitioner

## 2022-07-03 DIAGNOSIS — N400 Enlarged prostate without lower urinary tract symptoms: Secondary | ICD-10-CM

## 2022-07-04 ENCOUNTER — Telehealth: Payer: Self-pay | Admitting: Nurse Practitioner

## 2022-07-04 ENCOUNTER — Other Ambulatory Visit: Payer: Self-pay | Admitting: Nurse Practitioner

## 2022-07-04 ENCOUNTER — Encounter: Payer: Self-pay | Admitting: Nurse Practitioner

## 2022-07-04 ENCOUNTER — Ambulatory Visit: Payer: Medicare HMO | Admitting: Nurse Practitioner

## 2022-07-04 VITALS — BP 141/84 | HR 58 | Temp 98.1°F | Resp 20 | Ht 70.0 in | Wt 184.0 lb

## 2022-07-04 DIAGNOSIS — N400 Enlarged prostate without lower urinary tract symptoms: Secondary | ICD-10-CM

## 2022-07-04 DIAGNOSIS — E7800 Pure hypercholesterolemia, unspecified: Secondary | ICD-10-CM | POA: Diagnosis not present

## 2022-07-04 DIAGNOSIS — Z0001 Encounter for general adult medical examination with abnormal findings: Secondary | ICD-10-CM | POA: Diagnosis not present

## 2022-07-04 DIAGNOSIS — I10 Essential (primary) hypertension: Secondary | ICD-10-CM

## 2022-07-04 DIAGNOSIS — Z0000 Encounter for general adult medical examination without abnormal findings: Secondary | ICD-10-CM

## 2022-07-04 DIAGNOSIS — Z6827 Body mass index (BMI) 27.0-27.9, adult: Secondary | ICD-10-CM

## 2022-07-04 MED ORDER — TAMSULOSIN HCL 0.4 MG PO CAPS: ORAL_CAPSULE | ORAL | 5 refills | Status: DC

## 2022-07-04 MED ORDER — ATORVASTATIN CALCIUM 10 MG PO TABS: 10.0000 mg | ORAL_TABLET | ORAL | 1 refills | Status: DC

## 2022-07-04 NOTE — Telephone Encounter (Signed)
Prescription sent to pharmacy,  unable to reach patient, no answer, no voicemail

## 2022-07-04 NOTE — Progress Notes (Signed)
Subjective:    Patient ID: Jimmy Bonilla, male    DOB: October 09, 1950, 72 y.o.   MRN: [id number]   Chief Complaint: annual physical   HPI:  Jimmy Bonilla is a 72 y.o. who identifies as a male who was assigned male at birth.   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 Primary hypertension No c/o chest pain, sob or headache. Does not check blood pressure at home BP Readings from Last 3 Encounters:  12/30/21 135/80  06/03/21 134/74  12/13/20 124/86   .  2. Pure hypercholesterolemia Does try to watch diet and exercises daily. Lab Results  Component Value Date   CHOL 169 06/03/2021   HDL 41 06/03/2021   LDLCALC 106 (H) 06/03/2021   TRIG 123 06/03/2021   CHOLHDL 4.1 06/03/2021     3. Benign prostatic hyperplasia without lower urinary tract symptoms No voiding issues. Last saw urology 2 weeks ago and has follow  up in 3 months Lab Results  Component Value Date   PSA1 13.8 (H) 06/03/2021   PSA1 10.9 (H) 01/25/2019   PSA1 7.6 (H) 10/29/2017   PSA 6.3 (H) 09/19/2013   PSA 6.55 (H) 07/06/2012      4. BMI 27.0-27.9,adult No recent weight changes Wt Readings from Last 3 Encounters:  07/04/22 184 lb (83.5 kg)  04/11/22 189 lb (85.7 kg)  12/30/21 189 lb (85.7 kg)   BMI Readings from Last 3 Encounters:  07/04/22 26.40 kg/m  04/11/22 27.12 kg/m  12/30/21 27.91 kg/m     New complaints: None today  No Known Allergies Outpatient Encounter Medications as of 07/04/2022  Medication Sig   atorvastatin (LIPITOR) 10 MG tablet Take 1 tablet (10 mg total) by mouth every other day.   ibuprofen (ADVIL) 600 MG tablet Take 1 tablet (600 mg total) by mouth every 8 (eight) hours as needed.   Multiple Vitamins-Minerals (CENTRUM SILVER PO) Take 1 tablet by mouth.   sildenafil (REVATIO) 20 MG tablet TAKE ONE (1) TABLET THREE (3) TIMES EACH DAY   tamsulosin  (FLOMAX) 0.4 MG CAPS capsule TAKE ONE (1) CAPSULE EACH DAY (NEEDS TO BE SEEN BEFORE NEXT REFILL)   No facility-administered encounter medications on file as of 07/04/2022.    Past Surgical History:  Procedure Laterality Date   ANKLE FRACTURE SURGERY Left 1998   COLONOSCOPY W/ POLYPECTOMY     HERNIA REPAIR  2017    Family History  Problem Relation Age of Onset   Diabetes Father    Colon cancer Neg Hx       Controlled substance contract: n/a     Review of Systems  Constitutional:  Negative for diaphoresis.  Eyes:  Negative for pain.  Respiratory:  Negative for shortness of breath.   Cardiovascular:  Negative for chest pain, palpitations and leg swelling.  Gastrointestinal:  Negative for abdominal pain.  Endocrine: Negative for polydipsia.  Skin:  Negative for rash.  Neurological:  Negative for dizziness, weakness and headaches.  Hematological:  Does not bruise/bleed easily.  All other systems reviewed and are negative.      Objective:   Physical Exam Vitals and nursing note reviewed.  Constitutional:      Appearance: Normal appearance. He is well-developed.  HENT:     Head: Normocephalic.     Nose: Nose normal.     Mouth/Throat:     Mouth: Mucous membranes  are moist.     Pharynx: Oropharynx is clear.  Eyes:     Pupils: Pupils are equal, round, and reactive to light.  Neck:     Thyroid: No thyroid mass or thyromegaly.     Vascular: No carotid bruit or JVD.     Trachea: Phonation normal.  Cardiovascular:     Rate and Rhythm: Normal rate and regular rhythm.  Pulmonary:     Effort: Pulmonary effort is normal. No respiratory distress.     Breath sounds: Normal breath sounds.  Abdominal:     General: Bowel sounds are normal.     Palpations: Abdomen is soft.     Tenderness: There is no abdominal tenderness.  Musculoskeletal:        General: Normal range of motion.     Cervical back: Normal range of motion and neck supple.  Lymphadenopathy:     Cervical: No  cervical adenopathy.  Skin:    General: Skin is warm and dry.  Neurological:     Mental Status: He is alert and oriented to person, place, and time.  Psychiatric:        Behavior: Behavior normal.        Thought Content: Thought content normal.        Judgment: Judgment normal.    BP (!) 141/84   Pulse (!) 58   Temp 98.1 F (36.7 C) (Temporal)   Resp 20   Ht 5\' 10"  (1.778 m)   Wt 184 lb (83.5 kg)   SpO2 100%   BMI 26.40 kg/m   EKG- Kerry Hough, FNP       Assessment & Plan:   Jimmy Bonilla comes in today with chief complaint of Medical Management of Chronic Issues   Diagnosis and orders addressed:  1. Annual physical exam  - Thyroid Panel With TSH - VITAMIN D 25 Hydroxy (Vit-D Deficiency, Fractures)  2. Primary hypertension Low sodium diet - EKG 12-Lead - CBC with Differential/Platelet - CMP14+EGFR  3. Pure hypercholesterolemia Low fat diet - atorvastatin (LIPITOR) 10 MG tablet; Take 1 tablet (10 mg total) by mouth every other day.  Dispense: 90 tablet; Refill: 1 - Lipid panel  4. Benign prostatic hyperplasia without lower urinary tract symptoms Keep follow up with urology in 3 months  5. BMI 27.0-27.9,adult Discussed diet and exercise for person with BMI >25 Will recheck weight in 3-6 months    Labs pending Health Maintenance reviewed Diet and exercise encouraged  Follow up plan: 6 months   Mary-Margaret Hassell Done, FNP

## 2022-07-04 NOTE — Telephone Encounter (Signed)
  Prescription Request  07/04/2022  Is this a "Controlled Substance" medicine?   Have you seen your PCP in the last 2 weeks? Appt 3/15  If YES, route message to pool  -  If NO, patient needs to be scheduled for appointment.  What is the name of the medication or equipment?  tamsulosin (FLOMAX) 0.4 MG CAPS capsule   Have you contacted your pharmacy to request a refill? yes   Which pharmacy would you like this sent to?  [place], [place] (Ph: [phone])     Patient notified that their request is being sent to the clinical staff for review and that they should receive a response within 2 business days.

## 2022-07-05 LAB — CBC WITH DIFFERENTIAL/PLATELET
Basophils Absolute: 0 10*3/uL (ref 0.0–0.2)
Basos: 1 %
EOS (ABSOLUTE): 0.1 10*3/uL (ref 0.0–0.4)
Eos: 2 %
Hematocrit: 44.8 % (ref 37.5–51.0)
Hemoglobin: 15.2 g/dL (ref 13.0–17.7)
Immature Grans (Abs): 0 10*3/uL (ref 0.0–0.1)
Immature Granulocytes: 0 %
Lymphocytes Absolute: 1.5 10*3/uL (ref 0.7–3.1)
Lymphs: 22 %
MCH: 32.5 pg (ref 26.6–33.0)
MCHC: 33.9 g/dL (ref 31.5–35.7)
MCV: 96 fL (ref 79–97)
Monocytes Absolute: 0.6 10*3/uL (ref 0.1–0.9)
Monocytes: 10 %
Neutrophils Absolute: 4.4 10*3/uL (ref 1.4–7.0)
Neutrophils: 65 %
Platelets: 278 10*3/uL (ref 150–450)
RBC: 4.68 x10E6/uL (ref 4.14–5.80)
RDW: 12.4 % (ref 11.6–15.4)
WBC: 6.6 10*3/uL (ref 3.4–10.8)

## 2022-07-05 LAB — CMP14+EGFR
ALT: 12 IU/L (ref 0–44)
AST: 17 IU/L (ref 0–40)
Albumin/Globulin Ratio: 1.8 (ref 1.2–2.2)
Albumin: 4.4 g/dL (ref 3.9–4.9)
Alkaline Phosphatase: 111 IU/L (ref 44–121)
BUN/Creatinine Ratio: 18 (ref 10–24)
BUN: 16 mg/dL (ref 8–27)
Bilirubin Total: 0.5 mg/dL (ref 0.0–1.2)
CO2: 24 mmol/L (ref 20–29)
Calcium: 9.3 mg/dL (ref 8.6–10.2)
Chloride: 101 mmol/L (ref 96–106)
Creatinine, Ser: 0.91 mg/dL (ref 0.76–1.27)
Globulin, Total: 2.5 g/dL (ref 1.5–4.5)
Glucose: 83 mg/dL (ref 70–99)
Potassium: 4 mmol/L (ref 3.5–5.2)
Sodium: 141 mmol/L (ref 134–144)
Total Protein: 6.9 g/dL (ref 6.0–8.5)
eGFR: 91 mL/min/{1.73_m2} (ref >59)

## 2022-07-05 LAB — LIPID PANEL
Chol/HDL Ratio: 4.6 ratio (ref 0.0–5.0)
Cholesterol, Total: 185 mg/dL (ref 100–199)
HDL: 40 mg/dL (ref >39)
LDL Chol Calc (NIH): 118 mg/dL — ABNORMAL HIGH (ref 0–99)
Triglycerides: 151 mg/dL — ABNORMAL HIGH (ref 0–149)
VLDL Cholesterol Cal: 27 mg/dL (ref 5–40)

## 2022-07-05 LAB — VITAMIN D 25 HYDROXY (VIT D DEFICIENCY, FRACTURES): Vit D, 25-Hydroxy: 44.7 ng/mL (ref 30.0–100.0)

## 2022-07-05 LAB — THYROID PANEL WITH TSH
Free Thyroxine Index: 1.8 (ref 1.2–4.9)
T3 Uptake Ratio: 25 % (ref 24–39)
T4, Total: 7.2 ug/dL (ref 4.5–12.0)
TSH: 1.82 u[IU]/mL (ref 0.450–4.500)

## 2022-07-07 MED ORDER — ATORVASTATIN CALCIUM 40 MG PO TABS: 40.0000 mg | ORAL_TABLET | Freq: Every day | ORAL | 1 refills | Status: DC

## 2022-07-07 NOTE — Addendum Note (Signed)
Addended by: Chevis Pretty on: 07/07/2022 01:48 PM   Modules accepted: Orders

## 2022-09-16 ENCOUNTER — Other Ambulatory Visit: Payer: Self-pay | Admitting: Nurse Practitioner

## 2022-09-16 DIAGNOSIS — N400 Enlarged prostate without lower urinary tract symptoms: Secondary | ICD-10-CM

## 2022-10-25 ENCOUNTER — Other Ambulatory Visit: Payer: Self-pay | Admitting: Nurse Practitioner

## 2022-10-25 DIAGNOSIS — N529 Male erectile dysfunction, unspecified: Secondary | ICD-10-CM

## 2022-10-30 ENCOUNTER — Encounter: Payer: Self-pay | Admitting: Gastroenterology

## 2022-11-05 ENCOUNTER — Encounter: Payer: Self-pay | Admitting: Gastroenterology

## 2022-12-09 ENCOUNTER — Other Ambulatory Visit: Payer: Self-pay | Admitting: Nurse Practitioner

## 2022-12-09 DIAGNOSIS — N400 Enlarged prostate without lower urinary tract symptoms: Secondary | ICD-10-CM

## 2022-12-09 DIAGNOSIS — E7800 Pure hypercholesterolemia, unspecified: Secondary | ICD-10-CM

## 2022-12-12 ENCOUNTER — Encounter: Payer: Self-pay | Admitting: Gastroenterology

## 2022-12-12 ENCOUNTER — Ambulatory Visit (AMBULATORY_SURGERY_CENTER): Payer: Medicare HMO | Admitting: *Deleted

## 2022-12-12 VITALS — Ht 70.0 in | Wt 188.0 lb

## 2022-12-12 DIAGNOSIS — Z86010 Personal history of colonic polyps: Secondary | ICD-10-CM

## 2022-12-12 MED ORDER — NA SULFATE-K SULFATE-MG SULF 17.5-3.13-1.6 GM/177ML PO SOLN
1.0000 | Freq: Once | ORAL | 0 refills | Status: AC
Start: 2022-12-12 — End: 2022-12-12

## 2022-12-12 NOTE — Progress Notes (Signed)
 Pt's name and DOB verified at the beginning of the pre-visit.  Pt denies any difficulty with ambulating,sitting, laying down or rolling side to side Gave both [facility] main # and MD on call # prior to instructions.  No egg or soy allergy known to patient  No issues known to pt with past sedation with any surgeries or procedures Pt denies having issues being intubated Pt has no issues moving head neck or swallowing No FH of Malignant Hyperthermia Pt is not on diet pills Pt is not on home 02  Pt is not on blood thinners  Pt denies issues with constipation  Pt is not on dialysis Pt denise any abnormal heart rhythms  Pt denies any upcoming cardiac testing Pt encouraged to use to use Singlecare or Goodrx to reduce cost  Patient's chart reviewed by Cathlyn Parsons CNRA prior to pre-visit and patient appropriate for the [facility].  Pre-visit completed and red dot placed by patient's name on their procedure day (on provider's schedule).  . Visit by phone Pt states weight is 188 lb Instructed pt why it is important to and  to call if they have any changes in health or new medications. Directed them to the # given and on instructions.   Pt states they will.  Instructions reviewed with pt and pt states understanding. Instructed to review again prior to procedure. Pt states they will.  Instructions sent by mail with coupon and by my chart

## 2023-01-01 ENCOUNTER — Ambulatory Visit: Payer: Medicare HMO | Admitting: Nurse Practitioner

## 2023-01-01 ENCOUNTER — Encounter: Payer: Self-pay | Admitting: Nurse Practitioner

## 2023-01-01 VITALS — BP 133/80 | HR 51 | Temp 97.5°F | Resp 20 | Ht 70.0 in | Wt 186.0 lb

## 2023-01-01 DIAGNOSIS — E7800 Pure hypercholesterolemia, unspecified: Secondary | ICD-10-CM | POA: Diagnosis not present

## 2023-01-01 DIAGNOSIS — Z6827 Body mass index (BMI) 27.0-27.9, adult: Secondary | ICD-10-CM | POA: Diagnosis not present

## 2023-01-01 DIAGNOSIS — N400 Enlarged prostate without lower urinary tract symptoms: Secondary | ICD-10-CM

## 2023-01-01 DIAGNOSIS — I10 Essential (primary) hypertension: Secondary | ICD-10-CM

## 2023-01-01 DIAGNOSIS — R972 Elevated prostate specific antigen [PSA]: Secondary | ICD-10-CM

## 2023-01-01 MED ORDER — TAMSULOSIN HCL 0.4 MG PO CAPS
ORAL_CAPSULE | ORAL | 1 refills | Status: DC
Start: 2023-01-01 — End: 2023-06-22

## 2023-01-01 MED ORDER — ATORVASTATIN CALCIUM 40 MG PO TABS
40.0000 mg | ORAL_TABLET | Freq: Every day | ORAL | 1 refills | Status: DC
Start: 2023-01-01 — End: 2023-07-02

## 2023-01-01 NOTE — Progress Notes (Signed)
Subjective:    Patient ID: Jimmy Bonilla, male    DOB: 10-31-1950, 72 y.o.   MRN: [id number]   Chief Complaint: medical management of chronic issues     HPI:  Jimmy Bonilla is a 72 y.o. who identifies as a male who was assigned male at birth.   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   Comes in today for follow up of the following chronic medical issues:  1. Primary hypertension No c/o chest pain, sob or headache. Does not check blood pressure at home. BP Readings from Last 3 Encounters:  07/04/22 (!) 141/84  12/30/21 135/80  06/03/21 134/74     2. Pure hypercholesterolemia Does try to watch diet and stays very active. Lab Results  Component Value Date   CHOL 185 07/04/2022   HDL 40 07/04/2022   LDLCALC 118 (H) 07/04/2022   TRIG 151 (H) 07/04/2022   CHOLHDL 4.6 07/04/2022   The 10-year ASCVD risk score (Arnett DK, et al., 2019) is: 21.7%   3. Benign prostatic hyperplasia without lower urinary tract symptoms Is on flomax with no voiding issues. Last saw urology in March Lab Results  Component Value Date   PSA1 13.8 (H) 06/03/2021   PSA1 10.9 (H) 01/25/2019   PSA1 7.6 (H) 10/29/2017   PSA 6.3 (H) 09/19/2013   PSA 6.55 (H) 07/06/2012      4. Elevated prostate specific antigen (PSA) See above  5. BMI 27.0-27.9,adult No recent weight changes Wt Readings from Last 3 Encounters:  01/01/23 186 lb (84.4 kg)  12/12/22 188 lb (85.3 kg)  07/04/22 184 lb (83.5 kg)   BMI Readings from Last 3 Encounters:  01/01/23 26.69 kg/m  12/12/22 26.98 kg/m  07/04/22 26.40 kg/m        New complaints: None today  No Known Allergies Outpatient Encounter Medications as of 01/01/2023  Medication Sig   atorvastatin (LIPITOR) 40 MG tablet TAKE ONE (1) TABLET BY MOUTH EVERY DAY   Cholecalciferol (VITAMIN D3) 25 MCG (1000 UT) CAPS Take by mouth.   ibuprofen (ADVIL) 600 MG tablet Take 1 tablet (600 mg total)  by mouth every 8 (eight) hours as needed. (Patient not taking: Reported on 12/12/2022)   Misc Natural Products (BEET ROOT PO) Take by mouth.   Multiple Vitamins-Minerals (CENTRUM SILVER PO) Take 1 tablet by mouth.   sildenafil (REVATIO) 20 MG tablet TAKE ONE (1) TABLET THREE (3) TIMES EACH DAY   tamsulosin (FLOMAX) 0.4 MG CAPS capsule TAKE ONE (1) CAPSULE EACH DAY   No facility-administered encounter medications on file as of 01/01/2023.    Past Surgical History:  Procedure Laterality Date   ANKLE FRACTURE SURGERY Left 1998   COLONOSCOPY W/ POLYPECTOMY     Dental implant     HERNIA REPAIR  2017    Family History  Problem Relation Age of Onset   Colon polyps Mother    Diabetes Father    Colon cancer Neg Hx    Esophageal cancer Neg Hx    Rectal cancer Neg Hx    Stomach cancer Neg Hx       Controlled substance contract: n/a     Review of Systems  Constitutional:  Negative for diaphoresis.  Eyes:  Negative for pain.  Respiratory:  Negative for shortness of breath.   Cardiovascular:  Negative for chest pain, palpitations and leg swelling.  Gastrointestinal:  Negative for abdominal pain.  Endocrine: Negative for polydipsia.  Skin:  Negative for  rash.  Neurological:  Negative for dizziness, weakness and headaches.  Hematological:  Does not bruise/bleed easily.  All other systems reviewed and are negative.      Objective:   Physical Exam Vitals and nursing note reviewed.  Constitutional:      Appearance: Normal appearance. He is well-developed.  HENT:     Head: Normocephalic.     Nose: Nose normal.     Mouth/Throat:     Mouth: Mucous membranes are moist.     Pharynx: Oropharynx is clear.  Eyes:     Pupils: Pupils are equal, round, and reactive to light.  Neck:     Thyroid: No thyroid mass or thyromegaly.     Vascular: No carotid bruit or JVD.     Trachea: Phonation normal.  Cardiovascular:     Rate and Rhythm: Normal rate and regular rhythm.  Pulmonary:      Effort: Pulmonary effort is normal. No respiratory distress.     Breath sounds: Normal breath sounds.  Abdominal:     General: Bowel sounds are normal.     Palpations: Abdomen is soft.     Tenderness: There is no abdominal tenderness.  Musculoskeletal:        General: Normal range of motion.     Cervical back: Normal range of motion and neck supple.  Lymphadenopathy:     Cervical: No cervical adenopathy.  Skin:    General: Skin is warm and dry.  Neurological:     Mental Status: He is alert and oriented to person, place, and time.  Psychiatric:        Behavior: Behavior normal.        Thought Content: Thought content normal.        Judgment: Judgment normal.    BP 133/80   Pulse (!) 51   Temp (!) 97.5 F (36.4 C) (Temporal)   Resp 20   Ht 5\' 10"  (1.778 m)   Wt 186 lb (84.4 kg)   SpO2 99%   BMI 26.69 kg/m          Assessment & Plan:   [name] in today with chief complaint of Medical Management of Chronic Issues   1. Primary hypertension Low sodium diet - CBC with Differential/Platelet - CMP14+EGFR  2. Pure hypercholesterolemia Low fat diet - atorvastatin (LIPITOR) 40 MG tablet; Take 1 tablet (40 mg total) by mouth daily.  Dispense: 90 tablet; Refill: 1 - Lipid panel  3. Benign prostatic hyperplasia without lower urinary tract symptoms Keep follow up with cardiology - tamsulosin (FLOMAX) 0.4 MG CAPS capsule; TAKE ONE (1) CAPSULE EACH DAY  Dispense: 90 capsule; Refill: 1  4. Elevated prostate specific antigen (PSA)  5. BMI 27.0-27.9,adult Discussed diet and exercise for person with BMI >25 Will recheck weight in 3-6 months     The above assessment and management plan was discussed with the patient. The patient verbalized understanding of and has agreed to the management plan. Patient is aware to call the clinic if symptoms persist or worsen. Patient is aware when to return to the clinic for a follow-up visit. Patient educated on when it is  appropriate to go to the emergency department.   Jimmy Daphine Deutscher, FNP

## 2023-01-02 ENCOUNTER — Telehealth: Payer: Self-pay | Admitting: Nurse Practitioner

## 2023-01-02 LAB — CMP14+EGFR
ALT: 15 IU/L (ref 0–44)
AST: 20 IU/L (ref 0–40)
Albumin: 4.4 g/dL (ref 3.8–4.8)
Alkaline Phosphatase: 108 IU/L (ref 44–121)
BUN/Creatinine Ratio: 12 (ref 10–24)
BUN: 13 mg/dL (ref 8–27)
Bilirubin Total: 0.6 mg/dL (ref 0.0–1.2)
CO2: 24 mmol/L (ref 20–29)
Calcium: 9.7 mg/dL (ref 8.6–10.2)
Chloride: 104 mmol/L (ref 96–106)
Creatinine, Ser: 1.09 mg/dL (ref 0.76–1.27)
Globulin, Total: 2.6 g/dL (ref 1.5–4.5)
Glucose: 87 mg/dL (ref 70–99)
Potassium: 4.9 mmol/L (ref 3.5–5.2)
Sodium: 146 mmol/L — ABNORMAL HIGH (ref 134–144)
Total Protein: 7 g/dL (ref 6.0–8.5)
eGFR: 73 mL/min/{1.73_m2} (ref >59)

## 2023-01-02 LAB — CBC WITH DIFFERENTIAL/PLATELET
Basophils Absolute: 0 10*3/uL (ref 0.0–0.2)
Basos: 0 %
EOS (ABSOLUTE): 0.1 10*3/uL (ref 0.0–0.4)
Eos: 2 %
Hematocrit: 46.4 % (ref 37.5–51.0)
Hemoglobin: 14.9 g/dL (ref 13.0–17.7)
Immature Grans (Abs): 0 10*3/uL (ref 0.0–0.1)
Immature Granulocytes: 0 %
Lymphocytes Absolute: 1.5 10*3/uL (ref 0.7–3.1)
Lymphs: 24 %
MCH: 32.3 pg (ref 26.6–33.0)
MCHC: 32.1 g/dL (ref 31.5–35.7)
MCV: 100 fL — ABNORMAL HIGH (ref 79–97)
Monocytes Absolute: 0.6 10*3/uL (ref 0.1–0.9)
Monocytes: 10 %
Neutrophils Absolute: 3.9 10*3/uL (ref 1.4–7.0)
Neutrophils: 64 %
Platelets: 273 10*3/uL (ref 150–450)
RBC: 4.62 x10E6/uL (ref 4.14–5.80)
RDW: 12.3 % (ref 11.6–15.4)
WBC: 6.1 10*3/uL (ref 3.4–10.8)

## 2023-01-02 LAB — LIPID PANEL
Chol/HDL Ratio: 3 ratio (ref 0.0–5.0)
Cholesterol, Total: 137 mg/dL (ref 100–199)
HDL: 45 mg/dL (ref >39)
LDL Chol Calc (NIH): 75 mg/dL (ref 0–99)
Triglycerides: 86 mg/dL (ref 0–149)
VLDL Cholesterol Cal: 17 mg/dL (ref 5–40)

## 2023-01-02 MED ORDER — PREDNISONE 20 MG PO TABS: 40.0000 mg | ORAL_TABLET | Freq: Every day | ORAL | 0 refills | Status: DC

## 2023-01-02 NOTE — Telephone Encounter (Signed)
Patient was just seen in office and forgot to mention he had poison ivy rash on ars and legs. Is very itchy. Would like something called in. Meds ordered this encounter  Medications   predniSONE (DELTASONE) 20 MG tablet    Sig: Take 2 tablets (40 mg total) by mouth daily with breakfast for 5 days. 2 po daily for 5 days    Dispense:  10 tablet    Refill:  0    Order Specific Question:   Supervising Provider    Answer:   Nils Pyle [[id number]]   Mary-Margaret Daphine Deutscher, FNP

## 2023-01-02 NOTE — Telephone Encounter (Signed)
Left detailed message on patients answering machine that medicine was sent to the drug store

## 2023-01-02 NOTE — Telephone Encounter (Signed)
Patient wants to know if PCP would be willing to call in something for poison ivy to [facility]. He was seen in office on 9/12 and said he forgot to mention it. Offered a video visit for today 9/13 and he said he was unable to do this. Please call back and advise.

## 2023-01-05 ENCOUNTER — Ambulatory Visit (AMBULATORY_SURGERY_CENTER): Payer: Medicare HMO | Admitting: Gastroenterology

## 2023-01-05 ENCOUNTER — Encounter: Payer: Self-pay | Admitting: Gastroenterology

## 2023-01-05 VITALS — BP 118/70 | HR 49 | Temp 98.4°F | Resp 12 | Ht 70.0 in | Wt 188.0 lb

## 2023-01-05 DIAGNOSIS — Z09 Encounter for follow-up examination after completed treatment for conditions other than malignant neoplasm: Secondary | ICD-10-CM | POA: Diagnosis present

## 2023-01-05 DIAGNOSIS — D123 Benign neoplasm of transverse colon: Secondary | ICD-10-CM

## 2023-01-05 DIAGNOSIS — Z86010 Personal history of colonic polyps: Secondary | ICD-10-CM | POA: Diagnosis not present

## 2023-01-05 DIAGNOSIS — D122 Benign neoplasm of ascending colon: Secondary | ICD-10-CM | POA: Diagnosis not present

## 2023-01-05 DIAGNOSIS — D120 Benign neoplasm of cecum: Secondary | ICD-10-CM | POA: Diagnosis not present

## 2023-01-05 MED ORDER — SODIUM CHLORIDE 0.9 % IV SOLN: 500.0000 mL | INTRAVENOUS | Status: DC

## 2023-01-05 NOTE — Progress Notes (Signed)
Patient states there have been no changes to medical or surgical history since time of pre-visit. 

## 2023-01-05 NOTE — Op Note (Signed)
[facility] Patient Name: Jimmy Bonilla Procedure Date: 01/05/2023 10:55 AM MRN: [id number] Endoscopist: Sherilyn Cooter L. Myrtie Neither , MD, [id number] Age: 72 Referring MD:  Date of Birth: May 16, 1950 Gender: Male Account #: [id number] Procedure:                Colonoscopy Indications:              Increased risk colon polyp surveillance: Personal                            history of adenoma less than 10 mm in size                           Descending colon adenoma 2011, no polyps 2014 Medicines:                Monitored Anesthesia Care Procedure:                Pre-Anesthesia Assessment:                           - Prior to the procedure, a History and Physical                            was performed, and patient medications and                            allergies were reviewed. The patient's tolerance of                            previous anesthesia was also reviewed. The risks                            and benefits of the procedure and the sedation                            options and risks were discussed with the patient.                            All questions were answered, and informed consent                            was obtained. Prior Anticoagulants: The patient has                            taken no anticoagulant or antiplatelet agents. ASA                            Grade Assessment: II - A patient with mild systemic                            disease. After reviewing the risks and benefits,                            the patient was deemed in satisfactory condition to  undergo the procedure.                           After obtaining informed consent, the colonoscope                            was passed under direct vision. Throughout the                            procedure, the patient's blood pressure, pulse, and                            oxygen saturations were monitored continuously. The                            CF [id number] #[id number]  was introduced through the anus                            and advanced to the the cecum, identified by                            appendiceal orifice and ileocecal valve. The                            colonoscopy was performed without difficulty. The                            patient tolerated the procedure well. The quality                            of the bowel preparation was good. The ileocecal                            valve, appendiceal orifice, and rectum were                            photographed. Scope In: 11:17:37 AM Scope Out: 11:31:29 AM Scope Withdrawal Time: 0 hours 10 minutes 56 seconds  Total Procedure Duration: 0 hours 13 minutes 52 seconds  Findings:                 The perianal and digital rectal examinations were                            normal.                           Repeat examination of right colon under NBI                            performed.                           Four sessile polyps were found in the proximal  ascending colon and cecum. The polyps were 2 to 6                            mm in size. These polyps were removed with a cold                            snare. Resection and retrieval were complete.                           A 6-8 mm polyp was found in the proximal transverse                            colon. The polyp was sessile. The polyp was removed                            with a cold snare. Resection and retrieval were                            complete.                           The exam was otherwise without abnormality on                            direct and retroflexion views. Complications:            No immediate complications. Estimated Blood Loss:     Estimated blood loss was minimal. Impression:               - Four 2 to 6 mm polyps in the proximal ascending                            colon and in the cecum, removed with a cold snare.                            Resected and retrieved.                            - One 6-8 mm polyp in the proximal transverse                            colon, removed with a cold snare. Resected and                            retrieved.                           - The examination was otherwise normal on direct                            and retroflexion views. Recommendation:           - Patient has a contact number available for  emergencies. The signs and symptoms of potential                            delayed complications were discussed with the                            patient. Return to normal activities tomorrow.                            Written discharge instructions were provided to the                            patient.                           - Resume previous diet.                           - Continue present medications.                           - Await pathology results.                           - Repeat colonoscopy is recommended for                            surveillance. The colonoscopy date will be                            determined after pathology results from today's                            exam become available for review. [name], MD 01/05/2023 11:36:29 AM This report has been signed electronically.

## 2023-01-05 NOTE — Progress Notes (Signed)
Uneventful anesthetic. Report to pacu rn. Vss. Care resumed by rn. 

## 2023-01-05 NOTE — Patient Instructions (Signed)
Resume previous diet Continue present medications Await pathology results  Handouts/information given for polyps  YOU HAD AN ENDOSCOPIC PROCEDURE TODAY AT THE [facility]:   Refer to the procedure report that was given to you for any specific questions about what was found during the examination.  If the procedure report does not answer your questions, please call your gastroenterologist to clarify.  If you requested that your care partner not be given the details of your procedure findings, then the procedure report has been included in a sealed envelope for you to review at your convenience later.  YOU SHOULD EXPECT: Some feelings of bloating in the abdomen. Passage of more gas than usual.  Walking can help get rid of the air that was put into your GI tract during the procedure and reduce the bloating. If you had a lower endoscopy (such as a colonoscopy or flexible sigmoidoscopy) you may notice spotting of blood in your stool or on the toilet paper. If you underwent a bowel prep for your procedure, you may not have a normal bowel movement for a few days.  Please Note:  You might notice some irritation and congestion in your nose or some drainage.  This is from the oxygen used during your procedure.  There is no need for concern and it should clear up in a day or so.  SYMPTOMS TO REPORT IMMEDIATELY:  Following lower endoscopy (colonoscopy):  Excessive amounts of blood in the stool  Significant tenderness or worsening of abdominal pains  Swelling of the abdomen that is new, acute  Fever of 100F or higher  For urgent or emergent issues, a gastroenterologist can be reached at any hour by calling [phone]. Do not use MyChart messaging for urgent concerns.    DIET:  We do recommend a small meal at first, but then you may proceed to your regular diet.  Drink plenty of fluids but you should avoid alcoholic beverages for 24 hours.  ACTIVITY:  You should plan to take it easy for  the rest of today and you should NOT DRIVE or use heavy machinery until tomorrow (because of the sedation medicines used during the test).    FOLLOW UP: Our staff will call the number listed on your records the next business day following your procedure.  We will call around 7:15- 8:00 am to check on you and address any questions or concerns that you may have regarding the information given to you following your procedure. If we do not reach you, we will leave a message.     If any biopsies were taken you will be contacted by phone or by letter within the next 1-3 weeks.  Please call us at [phone] if you have not heard about the biopsies in 3 weeks.   SIGNATURES/CONFIDENTIALITY: You and/or your care partner have signed paperwork which will be entered into your electronic medical record.  These signatures attest to the fact that that the information above on your After Visit Summary has been reviewed and is understood.  Full responsibility of the confidentiality of this discharge information lies with you and/or your care-partner.

## 2023-01-05 NOTE — Progress Notes (Signed)
Called to room to assist during endoscopic procedure.  Patient ID and intended procedure confirmed with present staff. Received instructions for my participation in the procedure from the performing physician.  

## 2023-01-05 NOTE — Progress Notes (Signed)
History and Physical:  This patient presents for endoscopic testing for: Encounter Diagnosis  Name Primary?   Personal history of colonic polyps Yes    Surveillance colonoscopy for Hx colon polyps Left colon serrated adenoma - June 2011 No polyps Sept 2014 Patient denies chronic abdominal pain, rectal bleeding, constipation or diarrhea.   Patient is otherwise without complaints or active issues today.   Past Medical History: Past Medical History:  Diagnosis Date   Arthritis    Ankle and bilateral thumbs   Chronic kidney disease    Kidney stone   Enlarged prostate    Erectile dysfunction    Hyperlipidemia      Past Surgical History: Past Surgical History:  Procedure Laterality Date   ANKLE FRACTURE SURGERY Left 1998   COLONOSCOPY W/ POLYPECTOMY     Dental implant     HERNIA REPAIR  2017    Allergies: No Known Allergies  Outpatient Meds: Current Outpatient Medications  Medication Sig Dispense Refill   atorvastatin (LIPITOR) 40 MG tablet Take 1 tablet (40 mg total) by mouth daily. 90 tablet 1   Cholecalciferol (VITAMIN D3) 25 MCG (1000 UT) CAPS Take by mouth.     Misc Natural Products (BEET ROOT PO) Take by mouth.     Na Sulfate-K Sulfate-Mg Sulf 17.5-3.13-1.6 GM/177ML SOLN SMARTSIG:1 Kit(s) By Mouth Once     sildenafil (REVATIO) 20 MG tablet TAKE ONE (1) TABLET THREE (3) TIMES EACH DAY 50 tablet 2   tamsulosin (FLOMAX) 0.4 MG CAPS capsule TAKE ONE (1) CAPSULE EACH DAY 90 capsule 1   ibuprofen (ADVIL) 600 MG tablet Take 1 tablet (600 mg total) by mouth every 8 (eight) hours as needed. 30 tablet 0   Multiple Vitamins-Minerals (CENTRUM SILVER PO) Take 1 tablet by mouth.     Current Facility-Administered Medications  Medication Dose Route Frequency Provider Last Rate Last Admin   0.9 %  sodium chloride infusion  500 mL Intravenous Continuous Danis, Starr Lake III, MD          ___________________________________________________________________ Objective    Exam:  BP 135/85   Pulse (!) 46   Temp 98.4 F (36.9 C) (Skin)   Ht 5\' 10"  (1.778 m)   Wt 188 lb (85.3 kg)   SpO2 99%   BMI 26.98 kg/m   CV: regular , S1/S2 Resp: clear to auscultation bilaterally, normal RR and effort noted GI: soft, no tenderness, with active bowel sounds.   Assessment: Encounter Diagnosis  Name Primary?   Personal history of colonic polyps Yes     Plan: Colonoscopy   The benefits and risks of the planned procedure were described in detail with the patient or (when appropriate) their health care proxy.  Risks were outlined as including, but not limited to, bleeding, infection, perforation, adverse medication reaction leading to cardiac or pulmonary decompensation, pancreatitis (if ERCP).  The limitation of incomplete mucosal visualization was also discussed.  No guarantees or warranties were given.  The patient is appropriate for an endoscopic procedure in the ambulatory setting.   - Amada Jupiter, MD

## 2023-01-06 ENCOUNTER — Telehealth: Payer: Self-pay | Admitting: *Deleted

## 2023-01-06 NOTE — Telephone Encounter (Signed)
  Follow up Call-     01/05/2023   10:41 AM  Call back number  Post procedure Call Back phone  # [phone]  Permission to leave phone message Yes     Patient questions:  Do you have a fever, pain , or abdominal swelling? No. Pain Score  0 *  Have you tolerated food without any problems? Yes.    Have you been able to return to your normal activities? Yes.    Do you have any questions about your discharge instructions: Diet   No. Medications  No. Follow up visit  No.  Do you have questions or concerns about your Care? No.  Actions: * If pain score is 4 or above: No action needed, pain <4.

## 2023-01-13 ENCOUNTER — Encounter: Payer: Self-pay | Admitting: Gastroenterology

## 2023-05-01 ENCOUNTER — Ambulatory Visit (INDEPENDENT_AMBULATORY_CARE_PROVIDER_SITE_OTHER): Payer: Medicare HMO

## 2023-05-01 VITALS — Ht 70.0 in | Wt 188.0 lb

## 2023-05-01 DIAGNOSIS — Z0000 Encounter for general adult medical examination without abnormal findings: Secondary | ICD-10-CM

## 2023-05-01 NOTE — Progress Notes (Signed)
 Subjective:   Jimmy Bonilla is a 73 y.o. male who presents for Medicare Annual/Subsequent preventive examination.  Visit Complete: Virtual I connected with  Jimmy Bonilla on 05/01/23 by a audio enabled telemedicine application and verified that I am speaking with the correct person using two identifiers.  Patient Location: Home  Provider Location: Home Office  This patient declined Interactive audio and video telecommunications. Therefore the visit was completed with audio only.  I discussed the limitations of evaluation and management by telemedicine. The patient expressed understanding and agreed to proceed.  Vital Signs: Because this visit was a virtual/telehealth visit, some criteria may be missing or patient reported. Any vitals not documented were not able to be obtained and vitals that have been documented are patient reported.  Cardiac Risk Factors include: advanced age (>63men, >19 women);male gender;hypertension     Objective:    Today's Vitals   05/01/23 1204  Weight: 188 lb (85.3 kg)  Height: 5' 10 (1.778 m)   Body mass index is 26.98 kg/m.     05/01/2023   12:47 PM 04/11/2022   12:07 PM 01/22/2021    4:13 PM 02/02/2019    8:40 AM  Advanced Directives  Does Patient Have a Medical Advance Directive? No Yes Yes No  Type of Special Educational Needs Teacher of [place];Living will Healthcare Power of Many;Living will   Copy of Healthcare Power of Attorney in Chart?  No - copy requested No - copy requested   Would patient like information on creating a medical advance directive? Yes (MAU/Ambulatory/Procedural Areas - Information given)   No - Patient declined    Current Medications (verified) Outpatient Encounter Medications as of 05/01/2023  Medication Sig   atorvastatin  (LIPITOR) 40 MG tablet Take 1 tablet (40 mg total) by mouth daily.   Cholecalciferol (VITAMIN D3) 25 MCG (1000 UT) CAPS Take by mouth.   ibuprofen  (ADVIL ) 600 MG tablet Take 1 tablet  (600 mg total) by mouth every 8 (eight) hours as needed.   Misc Natural Products (BEET ROOT PO) Take by mouth.   Multiple Vitamins-Minerals (CENTRUM SILVER PO) Take 1 tablet by mouth.   Na Sulfate-K Sulfate-Mg Sulf 17.5-3.13-1.6 GM/177ML SOLN SMARTSIG:1 Kit(s) By Mouth Once   sildenafil  (REVATIO ) 20 MG tablet TAKE ONE (1) TABLET THREE (3) TIMES EACH DAY   tamsulosin  (FLOMAX ) 0.4 MG CAPS capsule TAKE ONE (1) CAPSULE EACH DAY   No facility-administered encounter medications on file as of 05/01/2023.    Allergies (verified) Patient has no known allergies.   History: Past Medical History:  Diagnosis Date   Arthritis    Ankle and bilateral thumbs   Chronic kidney disease    Kidney stone   Enlarged prostate    Erectile dysfunction    Hyperlipidemia    Past Surgical History:  Procedure Laterality Date   ANKLE FRACTURE SURGERY Left 1998   COLONOSCOPY W/ POLYPECTOMY     Dental implant     HERNIA REPAIR  2017   Family History  Problem Relation Age of Onset   Colon polyps Mother    Diabetes Father    Colon cancer Neg Hx    Esophageal cancer Neg Hx    Rectal cancer Neg Hx    Stomach cancer Neg Hx    Social History   Socioeconomic History   Marital status: Married    Spouse name: Slater   Number of children: 2   Years of education: 14   Highest education level: Associate degree: academic program  Occupational History  Occupation: retired  Tobacco Use   Smoking status: Never   Smokeless tobacco: Never  Vaping Use   Vaping status: Never Used  Substance and Sexual Activity   Alcohol use: Yes    Comment: rare   Drug use: No   Sexual activity: Not Currently  Other Topics Concern   Not on file  Social History Narrative   Not on file   Social Drivers of Health   Financial Resource Strain: Low Risk  (05/01/2023)   Overall Financial Resource Strain (CARDIA)    Difficulty of Paying Living Expenses: Not hard at all  Food Insecurity: No Food Insecurity (05/01/2023)   Hunger  Vital Sign    Worried About Running Out of Food in the Last Year: Never true    Ran Out of Food in the Last Year: Never true  Transportation Needs: No Transportation Needs (05/01/2023)   PRAPARE - Administrator, Civil Service (Medical): No    Lack of Transportation (Non-Medical): No  Physical Activity: Sufficiently Active (05/01/2023)   Exercise Vital Sign    Days of Exercise per Week: 5 days    Minutes of Exercise per Session: 30 min  Stress: No Stress Concern Present (05/01/2023)   [employer] of Occupational Health - Occupational Stress Questionnaire    Feeling of Stress : Not at all  Social Connections: Socially Integrated (05/01/2023)   Social Connection and Isolation Panel [NHANES]    Frequency of Communication with Friends and Family: More than three times a week    Frequency of Social Gatherings with Friends and Family: Three times a week    Attends Religious Services: More than 4 times per year    Active Member of Clubs or Organizations: Yes    Attends Engineer, Structural: More than 4 times per year    Marital Status: Married    Tobacco Counseling Counseling given: Not Answered   Clinical Intake:  Pre-visit preparation completed: Yes  Pain : No/denies pain     Diabetes: No  How often do you need to have someone help you when you read instructions, pamphlets, or other written materials from your doctor or pharmacy?: 1 - Never  Interpreter Needed?: No  Information entered by :: Charmaine Bloodgood LPN   Activities of Daily Living    05/01/2023   12:06 PM  In your present state of health, do you have any difficulty performing the following activities:  Hearing? 0  Vision? 0  Difficulty concentrating or making decisions? 0  Walking or climbing stairs? 0  Dressing or bathing? 0  Doing errands, shopping? 0  Preparing Food and eating ? N  Using the Toilet? N  In the past six months, have you accidently leaked urine? N  Do you have  problems with loss of bowel control? N  Managing your Medications? N  Managing your Finances? N  Housekeeping or managing your Housekeeping? N    Patient Care Team: Gladis Mustard, FNP as PCP - General (Nurse Practitioner)  Indicate any recent Medical Services you may have received from other than [facility] providers in the past year (date may be approximate).     Assessment:   This is a routine wellness examination for Adalberto.  Hearing/Vision screen Hearing Screening - Comments:: Denies hearing difficulties   Vision Screening - Comments::  up to date with routine eye exams with [facility]     Goals Addressed             This Visit's Progress  Remain active and independent        Depression Screen    05/01/2023   12:46 PM 01/01/2023   11:17 AM 04/11/2022   12:06 PM 12/30/2021    9:13 AM 06/03/2021    2:56 PM 01/22/2021    4:12 PM 12/13/2020    1:57 PM  PHQ 2/9 Scores  PHQ - 2 Score 0 0 0 0 0 0 0  PHQ- 9 Score  0  0 0 0 0    Fall Risk    05/01/2023   12:47 PM 01/01/2023   11:17 AM 04/11/2022   12:05 PM 06/03/2021    2:56 PM 01/22/2021    4:14 PM  Fall Risk   Falls in the past year? 0 1 0 0 0  Number falls in past yr: 0 0 0  0  Injury with Fall? 0 0 0  0  Risk for fall due to : No Fall Risks History of fall(s) No Fall Risks  No Fall Risks  Follow up Falls prevention discussed;Education provided;Falls evaluation completed Education provided Falls prevention discussed  Falls prevention discussed;Education provided    MEDICARE RISK AT HOME: Medicare Risk at Home Any stairs in or around the home?: No If so, are there any without handrails?: No Home free of loose throw rugs in walkways, pet beds, electrical cords, etc?: Yes Adequate lighting in your home to reduce risk of falls?: Yes Life alert?: No Use of a cane, walker or w/c?: No Grab bars in the bathroom?: Yes Shower chair or bench in shower?: No Elevated toilet seat or a handicapped toilet?:  Yes  TIMED UP AND GO:  Was the test performed?  No    Cognitive Function:        05/01/2023   12:47 PM 04/11/2022   12:07 PM 02/02/2019    8:43 AM  6CIT Screen  What Year? 0 points 0 points 0 points  What month? 0 points 0 points 0 points  What time? 0 points 0 points 0 points  Count back from 20 0 points 0 points 0 points  Months in reverse 0 points 0 points 0 points  Repeat phrase 0 points 0 points 0 points  Total Score 0 points 0 points 0 points    Immunizations Immunization History  Administered Date(s) Administered   Fluad Quad(high Dose 65+) 01/25/2019   Influenza,inj,Quad PF,6+ Mos 02/14/2018   Janssen (J&J) SARS-COV-2 Vaccination 07/24/2019   Pneumococcal Conjugate-13 10/29/2017   Pneumococcal Polysaccharide-23 02/06/1997, 01/25/2019   Tdap 08/23/2009, 06/03/2021    TDAP status: Up to date  Flu Vaccine status: Declined, Education has been provided regarding the importance of this vaccine but patient still declined. Advised may receive this vaccine at local pharmacy or Health Dept. Aware to provide a copy of the vaccination record if obtained from local pharmacy or Health Dept. Verbalized acceptance and understanding.  Pneumococcal vaccine status: Up to date  Covid-19 vaccine status: Information provided on how to obtain vaccines.   Qualifies for Shingles Vaccine? Yes   Zostavax completed No   Shingrix Completed?: No.    Education has been provided regarding the importance of this vaccine. Patient has been advised to call insurance company to determine out of pocket expense if they have not yet received this vaccine. Advised may also receive vaccine at local pharmacy or Health Dept. Verbalized acceptance and understanding.  Screening Tests Health Maintenance  Topic Date Due   Zoster Vaccines- Shingrix (1 of 2) Never done   COVID-19 Vaccine (2 -  2024-25 season) 12/21/2022   INFLUENZA VACCINE  07/20/2023 (Originally 11/20/2022)   Medicare Annual Wellness (AWV)   04/30/2024   Colonoscopy  01/04/2026   DTaP/Tdap/Td (3 - Td or Tdap) 06/04/2031   Pneumonia Vaccine 25+ Years old  Completed   Hepatitis C Screening  Completed   HPV VACCINES  Aged Out    Health Maintenance  Health Maintenance Due  Topic Date Due   Zoster Vaccines- Shingrix (1 of 2) Never done   COVID-19 Vaccine (2 - 2024-25 season) 12/21/2022    Colorectal cancer screening: Type of screening: Colonoscopy. Completed 01/05/23. Repeat every 3 years  Lung Cancer Screening: (Low Dose CT Chest recommended if Age 1-80 years, 20 pack-year currently smoking OR have quit w/in 15years.) does not qualify.   Lung Cancer Screening Referral: n/a  Additional Screening:  Hepatitis C Screening: does qualify; Completed 09/24/15  Vision Screening: Recommended annual ophthalmology exams for early detection of glaucoma and other disorders of the eye. Is the patient up to date with their annual eye exam?  Yes  Who is the provider or what is the name of the office in which the patient attends annual eye exams? [facility] If pt is not established with a provider, would they like to be referred to a provider to establish care? No .   Dental Screening: Recommended annual dental exams for proper oral hygiene  Community Resource Referral / Chronic Care Management: CRR required this visit?  No   CCM required this visit?  No     Plan:     I have personally reviewed and noted the following in the patient's chart:   Medical and social history Use of alcohol, tobacco or illicit drugs  Current medications and supplements including opioid prescriptions. Patient is not currently taking opioid prescriptions. Functional ability and status Nutritional status Physical activity Advanced directives List of other physicians Hospitalizations, surgeries, and ER visits in previous 12 months Vitals Screenings to include cognitive, depression, and falls Referrals and appointments  In addition, I have  reviewed and discussed with patient certain preventive protocols, quality metrics, and best practice recommendations. A written personalized care plan for preventive services as well as general preventive health recommendations were provided to patient.     Lavelle Pfeiffer [place], [place]   8/89/7974   After Visit Summary: (Mail) Due to this being a telephonic visit, the after visit summary with patients personalized plan was offered to patient via mail   Nurse Notes: No concerns at this time

## 2023-05-01 NOTE — Patient Instructions (Signed)
 Jimmy Bonilla , Thank you for taking time to come for your Medicare Wellness Visit. I appreciate your ongoing commitment to your health goals. Please review the following plan we discussed and let me know if I can assist you in the future.   Referrals/Orders/Follow-Ups/Clinician Recommendations: Aim for 30 minutes of exercise or brisk walking, 6-8 glasses of water, and 5 servings of fruits and vegetables each day.  This is a list of the screening recommended for you and due dates:  Health Maintenance  Topic Date Due   Zoster (Shingles) Vaccine (1 of 2) Never done   COVID-19 Vaccine (2 - 2024-25 season) 12/21/2022   Flu Shot  07/20/2023*   Medicare Annual Wellness Visit  04/30/2024   Colon Cancer Screening  01/04/2026   DTaP/Tdap/Td vaccine (3 - Td or Tdap) 06/04/2031   Pneumonia Vaccine  Completed   Hepatitis C Screening  Completed   HPV Vaccine  Aged Out  *Topic was postponed. The date shown is not the original due date.    Advanced directives: (ACP Link)Information on Advanced Care Planning can be found at [place]  Secretary of [facility] Advance Health Care Directives Advance Health Care Directives ([email or web address]   Next Medicare Annual Wellness Visit scheduled for next year: Yes

## 2023-06-20 ENCOUNTER — Other Ambulatory Visit: Payer: Self-pay | Admitting: Nurse Practitioner

## 2023-06-20 DIAGNOSIS — N400 Enlarged prostate without lower urinary tract symptoms: Secondary | ICD-10-CM

## 2023-06-25 ENCOUNTER — Ambulatory Visit: Payer: Medicare HMO | Admitting: Nurse Practitioner

## 2023-07-02 ENCOUNTER — Ambulatory Visit (INDEPENDENT_AMBULATORY_CARE_PROVIDER_SITE_OTHER): Admitting: Nurse Practitioner

## 2023-07-02 ENCOUNTER — Encounter: Payer: Self-pay | Admitting: Nurse Practitioner

## 2023-07-02 VITALS — BP 133/75 | HR 55 | Temp 98.2°F | Ht 70.0 in | Wt 183.0 lb

## 2023-07-02 DIAGNOSIS — E7800 Pure hypercholesterolemia, unspecified: Secondary | ICD-10-CM

## 2023-07-02 DIAGNOSIS — I10 Essential (primary) hypertension: Secondary | ICD-10-CM | POA: Diagnosis not present

## 2023-07-02 DIAGNOSIS — R972 Elevated prostate specific antigen [PSA]: Secondary | ICD-10-CM | POA: Diagnosis not present

## 2023-07-02 DIAGNOSIS — Z6827 Body mass index (BMI) 27.0-27.9, adult: Secondary | ICD-10-CM | POA: Diagnosis not present

## 2023-07-02 DIAGNOSIS — N400 Enlarged prostate without lower urinary tract symptoms: Secondary | ICD-10-CM | POA: Diagnosis not present

## 2023-07-02 MED ORDER — TAMSULOSIN HCL 0.4 MG PO CAPS
0.4000 mg | ORAL_CAPSULE | Freq: Every day | ORAL | 1 refills | Status: DC
Start: 2023-07-02 — End: 2024-02-09

## 2023-07-02 MED ORDER — ATORVASTATIN CALCIUM 40 MG PO TABS
40.0000 mg | ORAL_TABLET | Freq: Every day | ORAL | 1 refills | Status: DC
Start: 2023-07-02 — End: 2024-02-09

## 2023-07-02 NOTE — Progress Notes (Signed)
 Subjective:    Patient ID: Jimmy Bonilla, male    DOB: 1950-12-02, 73 y.o.   MRN: [id number]   Chief Complaint: medical management of chronic issues     HPI:  Jimmy Bonilla is a 73 y.o. who identifies as a male who was assigned male at birth.   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   Comes in today for follow up of the following chronic medical issues:  1. Primary hypertension No c/o chest pain, sob or headache. Does not check blood pressure at home. BP Readings from Last 3 Encounters:  07/02/23 133/75  01/05/23 118/70  01/01/23 133/80     2. Pure hypercholesterolemia Does try to watch diet and stays very active. Lab Results  Component Value Date   CHOL 137 01/01/2023   HDL 45 01/01/2023   LDLCALC 75 01/01/2023   TRIG 86 01/01/2023   CHOLHDL 3.0 01/01/2023   The 10-year ASCVD risk score (Arnett DK, et al., 2019) is: 18.1%   3. Benign prostatic hyperplasia without lower urinary tract symptoms Is on flomax with no voiding issues. Last saw urology this morning. He is to follow up in 6 months.Does not  need PSA repeated- was 15.2 at urology Lab Results  Component Value Date   PSA1 13.8 (H) 06/03/2021   PSA1 10.9 (H) 01/25/2019   PSA1 7.6 (H) 10/29/2017   PSA 6.3 (H) 09/19/2013   PSA 6.55 (H) 07/06/2012      4. Elevated prostate specific antigen (PSA) See above  5. BMI 27.0-27.9,adult No recent weight changes Wt Readings from Last 3 Encounters:  07/02/23 183 lb (83 kg)  05/01/23 188 lb (85.3 kg)  01/05/23 188 lb (85.3 kg)   BMI Readings from Last 3 Encounters:  07/02/23 26.26 kg/m  05/01/23 26.98 kg/m  01/05/23 26.98 kg/m        New complaints: None today  No Known Allergies Outpatient Encounter Medications as of 07/02/2023  Medication Sig   atorvastatin (LIPITOR) 40 MG tablet Take 1 tablet (40 mg total) by mouth daily.   Cholecalciferol (VITAMIN D3) 25 MCG (1000 UT) CAPS Take  by mouth.   ibuprofen (ADVIL) 600 MG tablet Take 1 tablet (600 mg total) by mouth every 8 (eight) hours as needed.   Misc Natural Products (BEET ROOT PO) Take by mouth.   Multiple Vitamins-Minerals (CENTRUM SILVER PO) Take 1 tablet by mouth.   sildenafil (REVATIO) 20 MG tablet TAKE ONE (1) TABLET THREE (3) TIMES EACH DAY   tamsulosin (FLOMAX) 0.4 MG CAPS capsule TAKE ONE CAPSULE BY MOUTH DAILY   [DISCONTINUED] Na Sulfate-K Sulfate-Mg Sulf 17.5-3.13-1.6 GM/177ML SOLN SMARTSIG:1 Kit(s) By Mouth Once   No facility-administered encounter medications on file as of 07/02/2023.    Past Surgical History:  Procedure Laterality Date   ANKLE FRACTURE SURGERY Left 1998   COLONOSCOPY W/ POLYPECTOMY     Dental implant     HERNIA REPAIR  2017    Family History  Problem Relation Age of Onset   Colon polyps Mother    Diabetes Father    Colon cancer Neg Hx    Esophageal cancer Neg Hx    Rectal cancer Neg Hx    Stomach cancer Neg Hx       Controlled substance contract: n/a     Review of Systems  Constitutional:  Negative for diaphoresis.  Eyes:  Negative for pain.  Respiratory:  Negative for shortness of breath.   Cardiovascular:  Negative  for chest pain, palpitations and leg swelling.  Gastrointestinal:  Negative for abdominal pain.  Endocrine: Negative for polydipsia.  Skin:  Negative for rash.  Neurological:  Negative for dizziness, weakness and headaches.  Hematological:  Does not bruise/bleed easily.  All other systems reviewed and are negative.      Objective:   Physical Exam Vitals and nursing note reviewed.  Constitutional:      Appearance: Normal appearance. He is well-developed.  HENT:     Head: Normocephalic.     Nose: Nose normal.     Mouth/Throat:     Mouth: Mucous membranes are moist.     Pharynx: Oropharynx is clear.  Eyes:     Pupils: Pupils are equal, round, and reactive to light.  Neck:     Thyroid: No thyroid mass or thyromegaly.     Vascular: No  carotid bruit or JVD.     Trachea: Phonation normal.  Cardiovascular:     Rate and Rhythm: Normal rate and regular rhythm.  Pulmonary:     Effort: Pulmonary effort is normal. No respiratory distress.     Breath sounds: Normal breath sounds.  Abdominal:     General: Bowel sounds are normal.     Palpations: Abdomen is soft.     Tenderness: There is no abdominal tenderness.  Musculoskeletal:        General: Normal range of motion.     Cervical back: Normal range of motion and neck supple.  Lymphadenopathy:     Cervical: No cervical adenopathy.  Skin:    General: Skin is warm and dry.  Neurological:     Mental Status: He is alert and oriented to person, place, and time.  Psychiatric:        Behavior: Behavior normal.        Thought Content: Thought content normal.        Judgment: Judgment normal.    BP 133/75   Pulse (!) 55   Temp 98.2 F (36.8 C) (Temporal)   Ht 5\' 10"  (1.778 m)   Wt 183 lb (83 kg)   SpO2 97%   BMI 26.26 kg/m          Assessment & Plan:   Jimmy Bonilla in today with chief complaint of Medical Management of Chronic Issues   1. Primary hypertension Low sodium diet - CBC with Differential/Platelet - CMP14+EGFR  2. Pure hypercholesterolemia Low fat diet - atorvastatin (LIPITOR) 40 MG tablet; Take 1 tablet (40 mg total) by mouth daily.  Dispense: 90 tablet; Refill: 1 - Lipid panel  3. Benign prostatic hyperplasia without lower urinary tract symptoms Keep follow up with cardiology - tamsulosin (FLOMAX) 0.4 MG CAPS capsule; TAKE ONE (1) CAPSULE EACH DAY  Dispense: 90 capsule; Refill: 1  4. Elevated prostate specific antigen (PSA)  5. BMI 27.0-27.9,adult Discussed diet and exercise for person with BMI >25 Will recheck weight in 3-6 months     The above assessment and management plan was discussed with the patient. The patient verbalized understanding of and has agreed to the management plan. Patient is aware to call the clinic if symptoms  persist or worsen. Patient is aware when to return to the clinic for a follow-up visit. Patient educated on when it is appropriate to go to the emergency department.   Mary-Margaret Daphine Deutscher, FNP

## 2023-07-03 LAB — LIPID PANEL
Chol/HDL Ratio: 3.2 ratio (ref 0.0–5.0)
Cholesterol, Total: 133 mg/dL (ref 100–199)
HDL: 41 mg/dL (ref >39)
LDL Chol Calc (NIH): 74 mg/dL (ref 0–99)
Triglycerides: 95 mg/dL (ref 0–149)
VLDL Cholesterol Cal: 18 mg/dL (ref 5–40)

## 2023-07-03 LAB — CBC WITH DIFFERENTIAL/PLATELET
Basophils Absolute: 0 10*3/uL (ref 0.0–0.2)
Basos: 0 %
EOS (ABSOLUTE): 0.1 10*3/uL (ref 0.0–0.4)
Eos: 2 %
Hematocrit: 42.3 % (ref 37.5–51.0)
Hemoglobin: 14.5 g/dL (ref 13.0–17.7)
Immature Grans (Abs): 0 10*3/uL (ref 0.0–0.1)
Immature Granulocytes: 0 %
Lymphocytes Absolute: 1.6 10*3/uL (ref 0.7–3.1)
Lymphs: 24 %
MCH: 32.9 pg (ref 26.6–33.0)
MCHC: 34.3 g/dL (ref 31.5–35.7)
MCV: 96 fL (ref 79–97)
Monocytes Absolute: 0.7 10*3/uL (ref 0.1–0.9)
Monocytes: 11 %
Neutrophils Absolute: 4.2 10*3/uL (ref 1.4–7.0)
Neutrophils: 63 %
Platelets: 260 10*3/uL (ref 150–450)
RBC: 4.41 x10E6/uL (ref 4.14–5.80)
RDW: 12.1 % (ref 11.6–15.4)
WBC: 6.7 10*3/uL (ref 3.4–10.8)

## 2023-07-03 LAB — CMP14+EGFR
ALT: 15 IU/L (ref 0–44)
AST: 22 IU/L (ref 0–40)
Albumin: 4.2 g/dL (ref 3.8–4.8)
Alkaline Phosphatase: 121 IU/L (ref 44–121)
BUN/Creatinine Ratio: 14 (ref 10–24)
BUN: 12 mg/dL (ref 8–27)
Bilirubin Total: 0.7 mg/dL (ref 0.0–1.2)
CO2: 25 mmol/L (ref 20–29)
Calcium: 9.2 mg/dL (ref 8.6–10.2)
Chloride: 102 mmol/L (ref 96–106)
Creatinine, Ser: 0.88 mg/dL (ref 0.76–1.27)
Globulin, Total: 2.3 g/dL (ref 1.5–4.5)
Glucose: 87 mg/dL (ref 70–99)
Potassium: 4.2 mmol/L (ref 3.5–5.2)
Sodium: 141 mmol/L (ref 134–144)
Total Protein: 6.5 g/dL (ref 6.0–8.5)
eGFR: 92 mL/min/{1.73_m2} (ref >59)

## 2023-09-28 ENCOUNTER — Other Ambulatory Visit: Payer: Self-pay | Admitting: Nurse Practitioner

## 2023-09-28 DIAGNOSIS — N529 Male erectile dysfunction, unspecified: Secondary | ICD-10-CM

## 2024-01-01 ENCOUNTER — Ambulatory Visit: Admitting: Nurse Practitioner

## 2024-02-09 ENCOUNTER — Encounter: Payer: Self-pay | Admitting: Nurse Practitioner

## 2024-02-09 ENCOUNTER — Ambulatory Visit: Admitting: Nurse Practitioner

## 2024-02-09 VITALS — BP 144/80 | HR 58 | Temp 97.6°F | Ht 70.0 in | Wt 181.0 lb

## 2024-02-09 DIAGNOSIS — Z0001 Encounter for general adult medical examination with abnormal findings: Secondary | ICD-10-CM

## 2024-02-09 DIAGNOSIS — N400 Enlarged prostate without lower urinary tract symptoms: Secondary | ICD-10-CM | POA: Diagnosis not present

## 2024-02-09 DIAGNOSIS — E7800 Pure hypercholesterolemia, unspecified: Secondary | ICD-10-CM | POA: Diagnosis not present

## 2024-02-09 DIAGNOSIS — Z0000 Encounter for general adult medical examination without abnormal findings: Secondary | ICD-10-CM

## 2024-02-09 DIAGNOSIS — I10 Essential (primary) hypertension: Secondary | ICD-10-CM | POA: Diagnosis not present

## 2024-02-09 DIAGNOSIS — N529 Male erectile dysfunction, unspecified: Secondary | ICD-10-CM

## 2024-02-09 DIAGNOSIS — Z6827 Body mass index (BMI) 27.0-27.9, adult: Secondary | ICD-10-CM

## 2024-02-09 DIAGNOSIS — R972 Elevated prostate specific antigen [PSA]: Secondary | ICD-10-CM

## 2024-02-09 MED ORDER — ATORVASTATIN CALCIUM 40 MG PO TABS: 40.0000 mg | ORAL_TABLET | Freq: Every day | ORAL | 1 refills | Status: AC

## 2024-02-09 MED ORDER — FLUTICASONE PROPIONATE 50 MCG/ACT NA SUSP
2.0000 | Freq: Every day | NASAL | 6 refills | Status: AC
Start: 2024-02-09 — End: ?

## 2024-02-09 MED ORDER — SILDENAFIL CITRATE 20 MG PO TABS: ORAL_TABLET | ORAL | 1 refills | Status: AC

## 2024-02-09 MED ORDER — TAMSULOSIN HCL 0.4 MG PO CAPS: 0.4000 mg | ORAL_CAPSULE | Freq: Every day | ORAL | 1 refills | Status: AC

## 2024-02-09 NOTE — Progress Notes (Signed)
 Subjective:    Patient ID: Jimmy Bonilla, male    DOB: 1950-12-06, 73 y.o.   MRN: [id number]   Chief Complaint: annual physical     HPI:  Jimmy Bonilla is a 73 y.o. who identifies as a male who was assigned male at birth.   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   Comes in today for follow up of the following chronic medical issues:  1. Primary hypertension No c/o chest pain, sob or headache. Does not check blood pressure at home. BP Readings from Last 3 Encounters:  07/02/23 133/75  01/05/23 118/70  01/01/23 133/80     2. Pure hypercholesterolemia Does try to watch diet and stays very active. Lab Results  Component Value Date   CHOL 133 07/02/2023   HDL 41 07/02/2023   LDLCALC 74 07/02/2023   TRIG 95 07/02/2023   CHOLHDL 3.2 07/02/2023   The 10-year ASCVD risk score (Arnett DK, et al., 2019) is: 20.2%   3. Benign prostatic hyperplasia without lower urinary tract symptoms Is on flomax  with no voiding issues. Last saw urology this morning. He is to follow up in 6 months.Does not  need PSA repeated- was 15.2 at urology Lab Results  Component Value Date   PSA1 13.8 (H) 06/03/2021   PSA1 10.9 (H) 01/25/2019   PSA1 7.6 (H) 10/29/2017   PSA 6.3 (H) 09/19/2013   PSA 6.55 (H) 07/06/2012      4. Elevated prostate specific antigen (PSA) See above  5. BMI 27.0-27.9,adult No recent weight changes  Wt Readings from Last 3 Encounters:  02/09/24 181 lb (82.1 kg)  07/02/23 183 lb (83 kg)  05/01/23 188 lb (85.3 kg)   BMI Readings from Last 3 Encounters:  02/09/24 25.97 kg/m  07/02/23 26.26 kg/m  05/01/23 26.98 kg/m         New complaints: None today  No Known Allergies Outpatient Encounter Medications as of 02/09/2024  Medication Sig   atorvastatin  (LIPITOR) 40 MG tablet Take 1 tablet (40 mg total) by mouth daily.   Cholecalciferol (VITAMIN D3) 25 MCG (1000 UT) CAPS Take by mouth.    ibuprofen  (ADVIL ) 600 MG tablet Take 1 tablet (600 mg total) by mouth every 8 (eight) hours as needed.   Misc Natural Products (BEET ROOT PO) Take by mouth.   Multiple Vitamins-Minerals (CENTRUM SILVER PO) Take 1 tablet by mouth.   sildenafil  (REVATIO ) 20 MG tablet TAKE ONE (1) TABLET THREE (3) TIMES EACH DAY   tamsulosin  (FLOMAX ) 0.4 MG CAPS capsule Take 1 capsule (0.4 mg total) by mouth daily.   No facility-administered encounter medications on file as of 02/09/2024.    Past Surgical History:  Procedure Laterality Date   ANKLE FRACTURE SURGERY Left 1998   COLONOSCOPY W/ POLYPECTOMY     Dental implant     HERNIA REPAIR  2017    Family History  Problem Relation Age of Onset   Colon polyps Mother    Diabetes Father    Colon cancer Neg Hx    Esophageal cancer Neg Hx    Rectal cancer Neg Hx    Stomach cancer Neg Hx       Controlled substance contract: n/a     Review of Systems  Constitutional:  Negative for diaphoresis.  Eyes:  Negative for pain.  Respiratory:  Negative for shortness of breath.   Cardiovascular:  Negative for chest pain, palpitations and leg swelling.  Gastrointestinal:  Negative for abdominal  pain.  Endocrine: Negative for polydipsia.  Skin:  Negative for rash.  Neurological:  Negative for dizziness, weakness and headaches.  Hematological:  Does not bruise/bleed easily.  All other systems reviewed and are negative.      Objective:   Physical Exam Vitals and nursing note reviewed.  Constitutional:      Appearance: Normal appearance. He is well-developed.  HENT:     Head: Normocephalic.     Nose: Nose normal.     Mouth/Throat:     Mouth: Mucous membranes are moist.     Pharynx: Oropharynx is clear.  Eyes:     Pupils: Pupils are equal, round, and reactive to light.  Neck:     Thyroid : No thyroid  mass or thyromegaly.     Vascular: No carotid bruit or JVD.     Trachea: Phonation normal.  Cardiovascular:     Rate and Rhythm: Normal rate and  regular rhythm.  Pulmonary:     Effort: Pulmonary effort is normal. No respiratory distress.     Breath sounds: Normal breath sounds.  Abdominal:     General: Bowel sounds are normal.     Palpations: Abdomen is soft.     Tenderness: There is no abdominal tenderness.  Musculoskeletal:        General: Normal range of motion.     Cervical back: Normal range of motion and neck supple.  Lymphadenopathy:     Cervical: No cervical adenopathy.  Skin:    General: Skin is warm and dry.  Neurological:     Mental Status: He is alert and oriented to person, place, and time.  Psychiatric:        Behavior: Behavior normal.        Thought Content: Thought content normal.        Judgment: Judgment normal.    BP (!) 144/80   Pulse (!) 58   Temp 97.6 F (36.4 C) (Temporal)   Ht 5' 10 (1.778 m)   Wt 181 lb (82.1 kg)   SpO2 98%   BMI 25.97 kg/m         Assessment & Plan:   [name] in today with chief complaint of annual physical  1. Primary hypertension Low sodium diet - CBC with Differential/Platelet - CMP14+EGFR  2. Pure hypercholesterolemia Low fat diet - atorvastatin  (LIPITOR) 40 MG tablet; Take 1 tablet (40 mg total) by mouth daily.  Dispense: 90 tablet; Refill: 1 - Lipid panel  3. Benign prostatic hyperplasia without lower urinary tract symptoms Keep follow up with cardiology - tamsulosin  (FLOMAX ) 0.4 MG CAPS capsule; TAKE ONE (1) CAPSULE EACH DAY  Dispense: 90 capsule; Refill: 1  4. Elevated prostate specific antigen (PSA)  5. BMI 27.0-27.9,adult Discussed diet and exercise for person with BMI >25 Will recheck weight in 3-6 months     The above assessment and management plan was discussed with the patient. The patient verbalized understanding of and has agreed to the management plan. Patient is aware to call the clinic if symptoms persist or worsen. Patient is aware when to return to the clinic for a follow-up visit. Patient educated on when it is appropriate  to go to the emergency department.   Mary-Margaret Gladis, FNP

## 2024-02-09 NOTE — Patient Instructions (Signed)
 Exercising to Stay Healthy To become healthy and stay healthy, it is recommended that you do moderate-intensity and vigorous-intensity exercise. You can tell that you are exercising at a moderate intensity if your heart starts beating faster and you start breathing faster but can still hold a conversation. You can tell that you are exercising at a vigorous intensity if you are breathing much harder and faster and cannot hold a conversation while exercising. How can exercise benefit me? Exercising regularly is important. It has many health benefits, such as: Improving overall fitness, flexibility, and endurance. Increasing bone density. Helping with weight control. Decreasing body fat. Increasing muscle strength and endurance. Reducing stress and tension, anxiety, depression, or anger. Improving overall health. What guidelines should I follow while exercising? Before you start a new exercise program, talk with your health care provider. Do not exercise so much that you hurt yourself, feel dizzy, or get very short of breath. Wear comfortable clothes and wear shoes with good support. Drink plenty of water while you exercise to prevent dehydration or heat stroke. Work out until your breathing and your heartbeat get faster (moderate intensity). How often should I exercise? Choose an activity that you enjoy, and set realistic goals. Your health care provider can help you make an activity plan that is individually designed and works best for you. Exercise regularly as told by your health care provider. This may include: Doing strength training two times a week, such as: Lifting weights. Using resistance bands. Push-ups. Sit-ups. Yoga. Doing a certain intensity of exercise for a given amount of time. Choose from these options: A total of 150 minutes of moderate-intensity exercise every week. A total of 75 minutes of vigorous-intensity exercise every week. A mix of moderate-intensity and  vigorous-intensity exercise every week. Children, pregnant women, people who have not exercised regularly, people who are overweight, and older adults may need to talk with a health care provider about what activities are safe to perform. If you have a medical condition, be sure to talk with your health care provider before you start a new exercise program. What are some exercise ideas? Moderate-intensity exercise ideas include: Walking 1 mile (1.6 km) in about 15 minutes. Biking. Hiking. Golfing. Dancing. Water aerobics. Vigorous-intensity exercise ideas include: Walking 4.5 miles (7.2 km) or more in about 1 hour. Jogging or running 5 miles (8 km) in about 1 hour. Biking 10 miles (16.1 km) or more in about 1 hour. Lap swimming. Roller-skating or in-line skating. Cross-country skiing. Vigorous competitive sports, such as football, basketball, and soccer. Jumping rope. Aerobic dancing. What are some everyday activities that can help me get exercise? Yard work, such as: Child psychotherapist. Raking and bagging leaves. Washing your car. Pushing a stroller. Shoveling snow. Gardening. Washing windows or floors. How can I be more active in my day-to-day activities? Use stairs instead of an elevator. Take a walk during your lunch break. If you drive, park your car farther away from your work or school. If you take public transportation, get off one stop early and walk the rest of the way. Stand up or walk around during all of your indoor phone calls. Get up, stretch, and walk around every 30 minutes throughout the day. Enjoy exercise with a friend. Support to continue exercising will help you keep a regular routine of activity. Where to find more information You can find more information about exercising to stay healthy from: U.S. Department of Health and Human Services: ThisPath.fi Centers for Disease Control and Prevention (  CDC): FootballExhibition.com.br Summary Exercising regularly is  important. It will improve your overall fitness, flexibility, and endurance. Regular exercise will also improve your overall health. It can help you control your weight, reduce stress, and improve your bone density. Do not exercise so much that you hurt yourself, feel dizzy, or get very short of breath. Before you start a new exercise program, talk with your health care provider. This information is not intended to replace advice given to you by your health care provider. Make sure you discuss any questions you have with your health care provider. Document Revised: 08/03/2020 Document Reviewed: 08/03/2020 Elsevier Patient Education  2024 [employer].

## 2024-02-10 LAB — CBC WITH DIFFERENTIAL/PLATELET
Basophils Absolute: 0 x10E3/uL (ref 0.0–0.2)
Basos: 0 %
EOS (ABSOLUTE): 0.2 x10E3/uL (ref 0.0–0.4)
Eos: 3 %
Hematocrit: 42.1 % (ref 37.5–51.0)
Hemoglobin: 14.1 g/dL (ref 13.0–17.7)
Immature Grans (Abs): 0 x10E3/uL (ref 0.0–0.1)
Immature Granulocytes: 0 %
Lymphocytes Absolute: 1.4 x10E3/uL (ref 0.7–3.1)
Lymphs: 20 %
MCH: 32.3 pg (ref 26.6–33.0)
MCHC: 33.5 g/dL (ref 31.5–35.7)
MCV: 96 fL (ref 79–97)
Monocytes Absolute: 0.7 x10E3/uL (ref 0.1–0.9)
Monocytes: 10 %
Neutrophils Absolute: 4.5 x10E3/uL (ref 1.4–7.0)
Neutrophils: 67 %
Platelets: 312 x10E3/uL (ref 150–450)
RBC: 4.37 x10E6/uL (ref 4.14–5.80)
RDW: 12.3 % (ref 11.6–15.4)
WBC: 6.9 x10E3/uL (ref 3.4–10.8)

## 2024-02-10 LAB — CMP14+EGFR
ALT: 20 IU/L (ref 0–44)
AST: 21 IU/L (ref 0–40)
Albumin: 4 g/dL (ref 3.8–4.8)
Alkaline Phosphatase: 112 IU/L (ref 47–123)
BUN/Creatinine Ratio: 17 (ref 10–24)
BUN: 15 mg/dL (ref 8–27)
Bilirubin Total: 0.5 mg/dL (ref 0.0–1.2)
CO2: 25 mmol/L (ref 20–29)
Calcium: 9.3 mg/dL (ref 8.6–10.2)
Chloride: 104 mmol/L (ref 96–106)
Creatinine, Ser: 0.87 mg/dL (ref 0.76–1.27)
Globulin, Total: 2.3 g/dL (ref 1.5–4.5)
Glucose: 99 mg/dL (ref 70–99)
Potassium: 4 mmol/L (ref 3.5–5.2)
Sodium: 142 mmol/L (ref 134–144)
Total Protein: 6.3 g/dL (ref 6.0–8.5)
eGFR: 92 mL/min/1.73 (ref >59)

## 2024-02-10 LAB — VITAMIN D 25 HYDROXY (VIT D DEFICIENCY, FRACTURES): Vit D, 25-Hydroxy: 48 ng/mL (ref 30.0–100.0)

## 2024-02-10 LAB — LIPID PANEL
Chol/HDL Ratio: 3.6 ratio (ref 0.0–5.0)
Cholesterol, Total: 123 mg/dL (ref 100–199)
HDL: 34 mg/dL — ABNORMAL LOW (ref >39)
LDL Chol Calc (NIH): 70 mg/dL (ref 0–99)
Triglycerides: 101 mg/dL (ref 0–149)
VLDL Cholesterol Cal: 19 mg/dL (ref 5–40)

## 2024-02-10 LAB — THYROID PANEL WITH TSH
Free Thyroxine Index: 2.1 (ref 1.2–4.9)
T3 Uptake Ratio: 27 % (ref 24–39)
T4, Total: 7.8 ug/dL (ref 4.5–12.0)
TSH: 1.79 u[IU]/mL (ref 0.450–4.500)

## 2024-02-11 ENCOUNTER — Ambulatory Visit: Payer: Self-pay | Admitting: Nurse Practitioner

## 2024-05-02 ENCOUNTER — Ambulatory Visit: Payer: Medicare HMO

## 2024-05-03 ENCOUNTER — Ambulatory Visit: Payer: Self-pay

## 2024-05-03 VITALS — Ht 70.0 in | Wt 181.0 lb

## 2024-05-03 DIAGNOSIS — Z0000 Encounter for general adult medical examination without abnormal findings: Secondary | ICD-10-CM

## 2024-05-03 NOTE — Patient Instructions (Signed)
 Mr. Sheller,  Thank you for taking the time for your Medicare Wellness Visit. I appreciate your continued commitment to your health goals. Please review the care plan we discussed, and feel free to reach out if I can assist you further.  Please note that Annual Wellness Visits do not include a physical exam. Some assessments may be limited, especially if the visit was conducted virtually. If needed, we may recommend an in-person follow-up with your provider.  Ongoing Care Seeing your primary care provider every 3 to 6 months helps us  monitor your health and provide consistent, personalized care.   Referrals If a referral was made during today's visit and you haven't received any updates within two weeks, please contact the referred provider directly to check on the status.  Recommended Screenings:  Health Maintenance  Topic Date Due   COVID-19 Vaccine (2 - 2025-26 season) 12/21/2023   Zoster (Shingles) Vaccine (1 of 2) 05/11/2024*   Flu Shot  07/19/2024*   Medicare Annual Wellness Visit  05/03/2025   Colon Cancer Screening  01/04/2026   DTaP/Tdap/Td vaccine (3 - Td or Tdap) 06/04/2031   Pneumococcal Vaccine for age over 45  Completed   Hepatitis C Screening  Completed   Meningitis B Vaccine  Aged Out  *Topic was postponed. The date shown is not the original due date.       05/03/2024    2:58 PM  Advanced Directives  Does Patient Have a Medical Advance Directive? No  Would patient like information on creating a medical advance directive? Yes (MAU/Ambulatory/Procedural Areas - Information given)   Information on Advanced Care Planning can be found at [place]  Secretary of [facility] Advance Health Care Directives Advance Health Care Directives ([email or web address]   Vision: Annual vision screenings are recommended for early detection of glaucoma, cataracts, and diabetic retinopathy. These exams can also reveal signs of chronic conditions such as diabetes and high blood pressure.  Dental:  Annual dental screenings help detect early signs of oral cancer, gum disease, and other conditions linked to overall health, including heart disease and diabetes.  Please see the attached documents for additional preventive care recommendations.

## 2024-05-03 NOTE — Progress Notes (Signed)
 "  Chief Complaint  Patient presents with   Medicare Wellness     Subjective:   Jimmy Bonilla is a 74 y.o. male who presents for a Medicare Annual Wellness Visit.  Visit info / Clinical Intake: Medicare Wellness Visit Type:: Subsequent Annual Wellness Visit Persons participating in visit and providing information:: patient Medicare Wellness Visit Mode:: Telephone If telephone:: video declined Since this visit was completed virtually, some vitals may be partially provided or unavailable. Missing vitals are due to the limitations of the virtual format.: Documented vitals are patient reported If Telephone or Video please confirm:: I connected with patient using audio/video enable telemedicine. I verified patient identity with two identifiers, discussed telehealth limitations, and patient agreed to proceed. Patient Location:: home Provider Location:: office Interpreter Needed?: No Pre-visit prep was completed: yes AWV questionnaire completed by patient prior to visit?: no Living arrangements:: lives with spouse/significant other Patient's Overall Health Status Rating: very good Typical amount of pain: some Does pain affect daily life?: no Are you currently prescribed opioids?: no  Dietary Habits and Nutritional Risks How many meals a day?: 3 Eats fruit and vegetables daily?: yes Most meals are obtained by: having others provide food In the last 2 weeks, have you had any of the following?: none Diabetic:: no  Functional Status Activities of Daily Living (to include ambulation/medication): Independent Ambulation: Independent Medication Administration: Independent Home Management (perform basic housework or laundry): Independent Manage your own finances?: yes Primary transportation is: driving Concerns about vision?: no *vision screening is required for WTM* Concerns about hearing?: no  Fall Screening Falls in the past year?: 0 Number of falls in past year: 0 Was there an  injury with Fall?: 0 Fall Risk Category Calculator: 0 Patient Fall Risk Level: Low Fall Risk  Fall Risk Patient at Risk for Falls Due to: No Fall Risks Fall risk Follow up: Falls prevention discussed; Falls evaluation completed; Education provided  Home and Transportation Safety: All rugs have non-skid backing?: yes All stairs or steps have railings?: yes Grab bars in the bathtub or shower?: yes Have non-skid surface in bathtub or shower?: yes Good home lighting?: yes Regular seat belt use?: yes Hospital stays in the last year:: no  Cognitive Assessment Difficulty concentrating, remembering, or making decisions? : no Will 6CIT or Mini Cog be Completed: no 6CIT or Mini Cog Declined: patient alert, oriented, able to answer questions appropriately and recall recent events  Advance Directives (For Healthcare) Does Patient Have a Medical Advance Directive?: No Would patient like information on creating a medical advance directive?: Yes (MAU/Ambulatory/Procedural Areas - Information given)  Reviewed/Updated  Reviewed/Updated: Reviewed All (Medical, Surgical, Family, Medications, Allergies, Care Teams, Patient Goals)    Allergies (verified) Patient has no known allergies.   Current Medications (verified) Outpatient Encounter Medications as of 05/03/2024  Medication Sig   atorvastatin  (LIPITOR) 40 MG tablet Take 1 tablet (40 mg total) by mouth daily.   Cholecalciferol (VITAMIN D3) 25 MCG (1000 UT) CAPS Take by mouth.   fluticasone  (FLONASE ) 50 MCG/ACT nasal spray Place 2 sprays into both nostrils daily.   ibuprofen  (ADVIL ) 600 MG tablet Take 1 tablet (600 mg total) by mouth every 8 (eight) hours as needed.   Misc Natural Products (BEET ROOT PO) Take by mouth.   Multiple Vitamins-Minerals (CENTRUM SILVER PO) Take 1 tablet by mouth.   sildenafil  (REVATIO ) 20 MG tablet TAKE ONE (1) TABLET THREE (3) TIMES EACH DAY   tamsulosin  (FLOMAX ) 0.4 MG CAPS capsule Take 1 capsule (0.4 mg total)  by mouth daily.   No facility-administered encounter medications on file as of 05/03/2024.    History: Past Medical History:  Diagnosis Date   Arthritis    Ankle and bilateral thumbs   Chronic kidney disease    Kidney stone   Enlarged prostate    Erectile dysfunction    Hyperlipidemia    Past Surgical History:  Procedure Laterality Date   ANKLE FRACTURE SURGERY Left 1998   COLONOSCOPY W/ POLYPECTOMY     Dental implant     HERNIA REPAIR  2017   Family History  Problem Relation Age of Onset   Colon polyps Mother    Diabetes Father    Colon cancer Neg Hx    Esophageal cancer Neg Hx    Rectal cancer Neg Hx    Stomach cancer Neg Hx    Social History   Occupational History   Occupation: retired  Tobacco Use   Smoking status: Never   Smokeless tobacco: Never  Vaping Use   Vaping status: Never Used  Substance and Sexual Activity   Alcohol use: Yes    Comment: rare   Drug use: No   Sexual activity: Not Currently   Tobacco Counseling Counseling given: Not Answered  SDOH Screenings   Food Insecurity: No Food Insecurity (05/03/2024)  Housing: Low Risk (05/03/2024)  Transportation Needs: No Transportation Needs (05/03/2024)  Utilities: Not At Risk (05/03/2024)  Alcohol Screen: Low Risk (05/01/2023)  Depression (PHQ2-9): Low Risk (05/03/2024)  Financial Resource Strain: Low Risk (05/01/2023)  Physical Activity: Sufficiently Active (05/03/2024)  Social Connections: Socially Integrated (05/03/2024)  Stress: No Stress Concern Present (05/03/2024)  Tobacco Use: Low Risk (05/03/2024)  Health Literacy: Adequate Health Literacy (05/03/2024)   See flowsheets for full screening details  Depression Screen PHQ 2 & 9 Depression Scale- Over the past 2 weeks, how often have you been bothered by any of the following problems? Little interest or pleasure in doing things: 0 Feeling down, depressed, or hopeless (PHQ Adolescent also includes...irritable): 0 PHQ-2 Total Score: 0     Goals  Addressed   None          Objective:    Today's Vitals   05/03/24 1450  Weight: 181 lb (82.1 kg)  Height: 5' 10 (1.778 m)   Body mass index is 25.97 kg/m.  Hearing/Vision screen Hearing Screening - Comments:: Patient is able to hear conversational tones without difficulty. No issues reported.   Immunizations and Health Maintenance Health Maintenance  Topic Date Due   COVID-19 Vaccine (2 - 2025-26 season) 12/21/2023   Zoster Vaccines- Shingrix (1 of 2) 05/11/2024 (Originally 12/29/2001)   Influenza Vaccine  07/19/2024 (Originally 11/20/2023)   Medicare Annual Wellness (AWV)  05/03/2025   Colonoscopy  01/04/2026   DTaP/Tdap/Td (3 - Td or Tdap) 06/04/2031   Pneumococcal Vaccine: 50+ Years  Completed   Hepatitis C Screening  Completed   Meningococcal B Vaccine  Aged Out        Assessment/Plan:  This is a routine wellness examination for Jimmy Bonilla.  Patient Care Team: Gladis Mustard, FNP as PCP - General (Nurse Practitioner) Nicholaus Tanda LITTIE DOUGLAS, MD as Attending Physician (Urology)  I have personally reviewed and noted the following in the patients chart:   Medical and social history Use of alcohol, tobacco or illicit drugs  Current medications and supplements including opioid prescriptions. Functional ability and status Nutritional status Physical activity Advanced directives List of other physicians Hospitalizations, surgeries, and ER visits in previous 12 months Vitals Screenings to include cognitive,  depression, and falls Referrals and appointments  No orders of the defined types were placed in this encounter.  In addition, I have reviewed and discussed with patient certain preventive protocols, quality metrics, and best practice recommendations. A written personalized care plan for preventive services as well as general preventive health recommendations were provided to patient.   Lavelle Charmaine Browner, LPN   8/86/7973   Return in 1 year (on  05/03/2025).  After Visit Summary: (Mail) Due to this being a telephonic visit, the after visit summary with patients personalized plan was offered to patient via mail   Nurse Notes: Patient advised to keep follow-up appointment with PCP (08/05/24 @ 8:00)  I have reviewed and agree with the above AWV documentation.   Mary-Margaret Gladis, FNP  "

## 2024-08-05 ENCOUNTER — Ambulatory Visit: Payer: Self-pay | Admitting: Nurse Practitioner
